# Patient Record
Sex: Male | Born: 1950 | Race: Black or African American | Hispanic: No | Marital: Married | Smoking: Former smoker
Health system: Southern US, Community
[De-identification: ages and names within clinical notes are randomized; demographics above are authoritative.]

## PROBLEM LIST (undated history)

## (undated) DIAGNOSIS — C801 Malignant (primary) neoplasm, unspecified: Secondary | ICD-10-CM

## (undated) DIAGNOSIS — M1990 Unspecified osteoarthritis, unspecified site: Secondary | ICD-10-CM

## (undated) DIAGNOSIS — I10 Essential (primary) hypertension: Secondary | ICD-10-CM

## (undated) DIAGNOSIS — R05 Cough: Secondary | ICD-10-CM

## (undated) DIAGNOSIS — K219 Gastro-esophageal reflux disease without esophagitis: Secondary | ICD-10-CM

## (undated) DIAGNOSIS — E119 Type 2 diabetes mellitus without complications: Secondary | ICD-10-CM

## (undated) DIAGNOSIS — T4145XA Adverse effect of unspecified anesthetic, initial encounter: Secondary | ICD-10-CM

## (undated) DIAGNOSIS — I499 Cardiac arrhythmia, unspecified: Secondary | ICD-10-CM

## (undated) DIAGNOSIS — T8859XA Other complications of anesthesia, initial encounter: Secondary | ICD-10-CM

## (undated) DIAGNOSIS — R059 Cough, unspecified: Secondary | ICD-10-CM

## (undated) DIAGNOSIS — M109 Gout, unspecified: Secondary | ICD-10-CM

## (undated) DIAGNOSIS — I2510 Atherosclerotic heart disease of native coronary artery without angina pectoris: Secondary | ICD-10-CM

## (undated) HISTORY — PX: CARDIAC CATHETERIZATION: SHX172

## (undated) HISTORY — PX: ROTATOR CUFF REPAIR: SHX139

## (undated) HISTORY — PX: COLON SURGERY: SHX602

## (undated) HISTORY — PX: CERVICAL SPINE SURGERY: SHX589

## (undated) HISTORY — PX: CORONARY ANGIOPLASTY: SHX604

## (undated) HISTORY — PX: COLONOSCOPY: SHX174

---

## 2008-03-10 ENCOUNTER — Ambulatory Visit (HOSPITAL_COMMUNITY): Admission: RE | Admit: 2008-03-10 | Discharge: 2008-03-11 | Payer: Self-pay | Admitting: Neurosurgery

## 2010-05-04 LAB — BASIC METABOLIC PANEL
CO2: 28 mEq/L (ref 19–32)
Calcium: 9.3 mg/dL (ref 8.4–10.5)
Creatinine, Ser: 1.35 mg/dL (ref 0.4–1.5)
Glucose, Bld: 95 mg/dL (ref 70–99)

## 2010-05-04 LAB — CBC
Hemoglobin: 13.8 g/dL (ref 13.0–17.0)
MCHC: 33.8 g/dL (ref 30.0–36.0)
MCV: 81.1 fL (ref 78.0–100.0)
RDW: 14 % (ref 11.5–15.5)

## 2010-05-04 LAB — GLUCOSE, CAPILLARY: Glucose-Capillary: 143 mg/dL — ABNORMAL HIGH (ref 70–99)

## 2010-05-04 LAB — DIFFERENTIAL
Basophils Absolute: 0 10*3/uL (ref 0.0–0.1)
Basophils Relative: 1 % (ref 0–1)
Eosinophils Absolute: 0.2 10*3/uL (ref 0.0–0.7)
Monocytes Absolute: 0.6 10*3/uL (ref 0.1–1.0)
Neutro Abs: 2.3 10*3/uL (ref 1.7–7.7)
Neutrophils Relative %: 45 % (ref 43–77)

## 2010-05-04 LAB — TYPE AND SCREEN: ABO/RH(D): A POS

## 2010-06-01 NOTE — Op Note (Signed)
NAMEMICHAL, CALLICOTT             ACCOUNT NO.:  [id number]   MEDICAL RECORD NO.:  [id number]          PATIENT TYPE:  OIB   LOCATION:  3528                         FACILITY:  [facility]   PHYSICIAN:  Henry A. Pool, M.D.    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   PROCEDURE:  C3-4, C4-5 anterior cervical diskectomy and fusion with  allograft and plating.   SURGEON:  Kathaleen Maser. Pool, MD   ASSISTANT:  Donalee Citrin, MD   ANESTHESIA:  General endotracheal.   INDICATION:  Mr. Tuckey is a 60 year old male with a history of neck and  right upper extremity pain consistent with a C4 and C5 radiculopathy.  Situation is complicated by coincidental right-sided rotator cuff  injury.  Workup demonstrates evidence of marked spondylosis with  stenosis, spinal cord and exiting nerve root compression at C3-4 and C4-  5.  It has been decided to move forward with the cervical decompression  and fusion, following which his shoulder would be further assessed for  possible shoulder surgery.  The patient is aware the risks and benefits  involved with surgery and wishes to proceed.   OPERATIVE NOTE:  The patient was placed on operating table in supine  position.  After adequate level of anesthesia was achieved, the patient  was placed supine with the neck slightly extended and held in place with  halter traction.  The patient's anterior cervical region was prepped and  draped sterilely.  A #10 blade was used to make a linear skin incision  overlying the C4 level.  This was carried down sharply to the platysma.  The platysma was then divided vertically and dissection proceeded on the  medial border of the sternocleidomastoid muscle and carotid sheath.  Trachea and esophagus were mobilized and tracked towards the left.  The  prevertebral fascia was stripped off the anterior spinal column.  Longus  colli muscle was elevated bilaterally using electrocautery.  Deep self-  retaining retractor was placed.  Intraoperative fluoroscopy was used,  and C3-4 and C4-5 levels were confirmed.  Disk space then incised at  both levels using a #15 blade in rectangular fashion.  Wide disk space  clean-out was achieved using pituitary rongeurs, forward and backward  Karlin curettes, Kerrison rongeurs, and high-speed drill.  Elements of  the disk removed down to the level of the posterior annulus at both  levels.  Microscope was then brought into the field and used throughout  the remainder of the diskectomy.  The remaining osteophytes and annulus  were removed using high-speed drill down to the level of the posterior  longitudinal ligament.  The posterior longitudinal ligament was then  elevated and resected in piecemeal fashion using Kerrison rongeurs.  The  underlying thecal sac was then identified.  Wide central decompression  was then performed undercutting the bodies of C3 and C4.  Decompression  was then proceeded out to the edges of the neural foramen.  Wide  anterior foraminotomies were then performed along the course of the  exiting C4 nerve roots bilaterally.  At this point, a very thorough  decompression was achieved.  There was no injury to thecal sac or nerve  roots.  Procedure then repeated at C4-5 again without complication.  Wound was then irrigated with antibiotic solution.  Gelfoam was placed  topically.  Hemostasis found to be good.  A 7-mm Cornerstone allograft  wedges were then packed into place and recessed approximately 1 mm from  the anterior cortical margin.  A 42-mm Atlantis anterior cervical plate  was then placed over the C3, C4, and C5 levels.  This attached under  fluoroscopic guidance using 13-mm variable angle screws to each  at all  three levels.  All four screws given final tightening and found to be  solid bone.  Locking screws were then engaged at all three levels.  Final images revealed good position of the bone grafts, hardware at  proper operative level with normal alignment of spine.  The wound was  then irrigated with antibiotic solution.  Hemostasis was ensured with  bipolar electrocautery.  The wound was then closed in typical fashion.  Steri-Strips and sterile dressings were applied.  There were no  complications.  The patient tolerated the procedure well, and he returns  to the recovery room postoperatively.           ______________________________  Kathaleen Maser Pool, M.D.     HAP/MEDQ  D:  03/10/2008  T:  03/11/2008  Job:  [id number]

## 2011-11-08 ENCOUNTER — Other Ambulatory Visit: Payer: Self-pay | Admitting: Neurosurgery

## 2011-11-08 DIAGNOSIS — M5412 Radiculopathy, cervical region: Secondary | ICD-10-CM

## 2011-11-15 ENCOUNTER — Inpatient Hospital Stay: Admission: RE | Admit: 2011-11-15 | Payer: Self-pay | Source: Ambulatory Visit

## 2011-11-15 ENCOUNTER — Other Ambulatory Visit: Payer: Self-pay

## 2011-11-23 ENCOUNTER — Other Ambulatory Visit: Payer: Self-pay | Admitting: Neurosurgery

## 2011-11-23 ENCOUNTER — Ambulatory Visit
Admission: RE | Admit: 2011-11-23 | Discharge: 2011-11-23 | Disposition: A | Discharge status: Home / Self Care | Payer: Medicare Other | Source: Ambulatory Visit | Attending: Neurosurgery | Admitting: Neurosurgery

## 2011-11-23 DIAGNOSIS — M5412 Radiculopathy, cervical region: Secondary | ICD-10-CM

## 2013-05-24 ENCOUNTER — Other Ambulatory Visit: Payer: Self-pay | Admitting: Neurosurgery

## 2013-05-27 ENCOUNTER — Encounter (HOSPITAL_COMMUNITY): Payer: Self-pay | Admitting: Pharmacy Technician

## 2013-05-29 ENCOUNTER — Encounter (HOSPITAL_COMMUNITY): Payer: Self-pay | Admitting: *Deleted

## 2013-05-29 ENCOUNTER — Encounter (HOSPITAL_COMMUNITY)
Admission: RE | Admit: 2013-05-29 | Discharge: 2013-05-29 | Disposition: A | Discharge status: Home / Self Care | Payer: BC Managed Care – PPO | Source: Ambulatory Visit | Attending: Neurosurgery | Admitting: Neurosurgery

## 2013-05-29 ENCOUNTER — Ambulatory Visit (HOSPITAL_COMMUNITY)
Admission: RE | Admit: 2013-05-29 | Discharge: 2013-05-29 | Disposition: A | Discharge status: Home / Self Care | Payer: BC Managed Care – PPO | Source: Ambulatory Visit | Attending: Neurosurgery | Admitting: Neurosurgery

## 2013-05-29 DIAGNOSIS — R05 Cough: Secondary | ICD-10-CM | POA: Diagnosis not present

## 2013-05-29 DIAGNOSIS — I10 Essential (primary) hypertension: Secondary | ICD-10-CM | POA: Diagnosis not present

## 2013-05-29 DIAGNOSIS — R059 Cough, unspecified: Secondary | ICD-10-CM | POA: Diagnosis not present

## 2013-05-29 DIAGNOSIS — Z01818 Encounter for other preprocedural examination: Secondary | ICD-10-CM | POA: Diagnosis not present

## 2013-05-29 LAB — CBC WITH DIFFERENTIAL/PLATELET
BASOS ABS: 0 10*3/uL (ref 0.0–0.1)
BASOS PCT: 0 % (ref 0–1)
EOS ABS: 0.4 10*3/uL (ref 0.0–0.7)
Eosinophils Relative: 8 % — ABNORMAL HIGH (ref 0–5)
HEMATOCRIT: 38 % — AB (ref 39.0–52.0)
HEMOGLOBIN: 12.4 g/dL — AB (ref 13.0–17.0)
Lymphocytes Relative: 37 % (ref 12–46)
Lymphs Abs: 1.7 10*3/uL (ref 0.7–4.0)
MCH: 25.6 pg — ABNORMAL LOW (ref 26.0–34.0)
MCHC: 32.6 g/dL (ref 30.0–36.0)
MCV: 78.4 fL (ref 78.0–100.0)
MONO ABS: 0.3 10*3/uL (ref 0.1–1.0)
MONOS PCT: 7 % (ref 3–12)
Neutro Abs: 2.2 10*3/uL (ref 1.7–7.7)
Neutrophils Relative %: 48 % (ref 43–77)
Platelets: 151 10*3/uL (ref 150–400)
RBC: 4.85 MIL/uL (ref 4.22–5.81)
RDW: 15.5 % (ref 11.5–15.5)
WBC: 4.7 10*3/uL (ref 4.0–10.5)

## 2013-05-29 LAB — BASIC METABOLIC PANEL
BUN: 14 mg/dL (ref 6–23)
CHLORIDE: 103 meq/L (ref 96–112)
CO2: 25 mEq/L (ref 19–32)
CREATININE: 1.2 mg/dL (ref 0.50–1.35)
Calcium: 8.8 mg/dL (ref 8.4–10.5)
GFR calc Af Amer: 74 mL/min — ABNORMAL LOW (ref >90)
GFR calc non Af Amer: 64 mL/min — ABNORMAL LOW (ref >90)
GLUCOSE: 162 mg/dL — AB (ref 70–99)
POTASSIUM: 3.4 meq/L — AB (ref 3.7–5.3)
Sodium: 142 mEq/L (ref 137–147)

## 2013-05-29 LAB — SURGICAL PCR SCREEN
MRSA, PCR: NEGATIVE
Staphylococcus aureus: NEGATIVE

## 2013-05-29 NOTE — Progress Notes (Signed)
REQUESTED CARDIAC CLEARANCE FROM DR. CHIU HIGH PT  REGIONAL, CARDIAC CATH, STRESS TEST, ? ECHO.

## 2013-05-29 NOTE — Pre-Procedure Instructions (Signed)
Asante Blanda Letizia  05/29/2013   Your procedure is scheduled on: Friday  05/31/13   Report to [facility] Admitting at 530 AM.  Call this number if you have problems the morning of surgery: [phone]   Remember:   Do not eat food or drink liquids after midnight.   Take these medicines the morning of surgery with A SIP OF WATER:  NEXIUM, METOPROLOL(TOPROL), POTASSIUM, RANITIDINE,  FLOMAX    Do not wear jewelry, make-up or nail polish.  Do not wear lotions, powders, or perfumes. You may wear deodorant.  Do not shave 48 hours prior to surgery. Men may shave face and neck.  Do not bring valuables to the hospital.  [facility] is not responsible                  for any belongings or valuables.               Contacts, dentures or bridgework may not be worn into surgery.  Leave suitcase in the car. After surgery it may be brought to your room.  For patients admitted to the hospital, discharge time is determined by your                treatment team.               Patients discharged the day of surgery will not be allowed to drive  home.  Name and phone number of your driver:   Special Instructions:  Special Instructions: [facility] - Preparing for Surgery  Before surgery, you can play an important role.  Because skin is not sterile, your skin needs to be as free of germs as possible.  You can reduce the number of germs on you skin by washing with CHG (chlorahexidine gluconate) soap before surgery.  CHG is an antiseptic cleaner which kills germs and bonds with the skin to continue killing germs even after washing.  Please DO NOT use if you have an allergy to CHG or antibacterial soaps.  If your skin becomes reddened/irritated stop using the CHG and inform your nurse when you arrive at Short Stay.  Do not shave (including legs and underarms) for at least 48 hours prior to the first CHG shower.  You may shave your face.  Please follow these instructions carefully:   1.  Shower  with CHG Soap the night before surgery and the morning of Surgery.  2.  If you choose to wash your hair, wash your hair first as usual with your normal shampoo.  3.  After you shampoo, rinse your hair and body thoroughly to remove the Shampoo.  4.  Use CHG as you would any other liquid soap. You can apply chg directly to the skin and wash gently with scrungie or a clean washcloth.  5.  Apply the CHG Soap to your body ONLY FROM THE NECK DOWN.  Do not use on open wounds or open sores.  Avoid contact with your eyes, ears, mouth and genitals (private parts).  Wash genitals (private parts with your normal soap.  6.  Wash thoroughly, paying special attention to the area where your surgery will be performed.  7.  Thoroughly rinse your body with warm water from the neck down.  8.  DO NOT shower/wash with your normal soap after using and rinsing off the CHG Soap.  9.  Pat yourself dry with a clean towel.            10.  Wear clean pajamas.            11.  Place clean sheets on your bed the night of your first shower and do not sleep with pets.  Day of Surgery  Do not apply any lotions/deodorants the morning of surgery.  Please wear clean clothes to the hospital/surgery center.   Please read over the following fact sheets that you were given: Pain Booklet, Coughing and Deep Breathing, MRSA Information and Surgical Site Infection Prevention

## 2013-05-29 NOTE — Progress Notes (Signed)
05/29/13 1411  OBSTRUCTIVE SLEEP APNEA  Have you ever been diagnosed with sleep apnea through a sleep study? No  Do you snore loudly (loud enough to be heard through closed doors)?  1  Do you often feel tired, fatigued, or sleepy during the daytime? 1  Has anyone observed you stop breathing during your sleep? 0  Do you have, or are you being treated for high blood pressure? 1  BMI more than 35 kg/m2? 1  Age over 61 years old? 1  Neck circumference greater than 40 cm/16 inches? 1  Gender: 1  Obstructive Sleep Apnea Score 7  Score 4 or greater  Results sent to PCP

## 2013-05-30 ENCOUNTER — Encounter (HOSPITAL_COMMUNITY): Payer: Self-pay

## 2013-05-30 HISTORY — PX: CARDIOVASCULAR STRESS TEST: SHX262

## 2013-05-30 MED ORDER — DEXAMETHASONE SODIUM PHOSPHATE 10 MG/ML IJ SOLN
10.0000 mg | INTRAMUSCULAR | Status: AC
  Administered 2013-05-31: 10 mg via INTRAVENOUS
  Filled 2013-05-30: qty 1

## 2013-05-30 MED ORDER — CEFAZOLIN SODIUM-DEXTROSE 2-3 GM-% IV SOLR
2.0000 g | INTRAVENOUS | Status: AC
  Administered 2013-05-31: 2 g via INTRAVENOUS
  Filled 2013-05-30: qty 50

## 2013-05-30 NOTE — Progress Notes (Signed)
Anesthesia Note: I received a fax after 5 PM today that includes a clearance note signed by Roseanne Reno, PA-C/for Dr. Wyline Copas clearing patient for surgery. He had a nuclear stress test today at [facility] that showed: No evidence of pharmacological induced ischemia. Fixed defect involving the inferiolateral wall along the mid and basilar segments. An area of infarct cannot be excluded based on the hypokinesia in this area. Calculated EF is 46%.  Further evaluation per his anesthesiologist tomorrow.  George Hugh [facility]/Anesthesiology Phone [phone] 05/30/2013 6:55 PM

## 2013-05-31 ENCOUNTER — Encounter (HOSPITAL_COMMUNITY)
Admission: RE | Disposition: A | Discharge status: Home / Self Care | Payer: Self-pay | Source: Ambulatory Visit | Attending: Neurosurgery

## 2013-05-31 ENCOUNTER — Inpatient Hospital Stay (HOSPITAL_COMMUNITY)
Admission: RE | Admit: 2013-05-31 | Discharge: 2013-06-01 | DRG: 472 | Disposition: A | Discharge status: Home / Self Care | Payer: BC Managed Care – PPO | Source: Ambulatory Visit | Attending: Neurosurgery | Admitting: Neurosurgery

## 2013-05-31 ENCOUNTER — Encounter (HOSPITAL_COMMUNITY): Payer: BC Managed Care – PPO | Admitting: Vascular Surgery

## 2013-05-31 ENCOUNTER — Inpatient Hospital Stay (HOSPITAL_COMMUNITY): Payer: BC Managed Care – PPO | Admitting: Certified Registered"

## 2013-05-31 ENCOUNTER — Encounter (HOSPITAL_COMMUNITY): Payer: Self-pay | Admitting: *Deleted

## 2013-05-31 ENCOUNTER — Inpatient Hospital Stay (HOSPITAL_COMMUNITY): Payer: BC Managed Care – PPO

## 2013-05-31 DIAGNOSIS — Z79899 Other long term (current) drug therapy: Secondary | ICD-10-CM

## 2013-05-31 DIAGNOSIS — Z7982 Long term (current) use of aspirin: Secondary | ICD-10-CM

## 2013-05-31 DIAGNOSIS — M5000 Cervical disc disorder with myelopathy, unspecified cervical region: Secondary | ICD-10-CM | POA: Diagnosis present

## 2013-05-31 DIAGNOSIS — Z85038 Personal history of other malignant neoplasm of large intestine: Secondary | ICD-10-CM

## 2013-05-31 DIAGNOSIS — K219 Gastro-esophageal reflux disease without esophagitis: Secondary | ICD-10-CM | POA: Diagnosis present

## 2013-05-31 DIAGNOSIS — Z9861 Coronary angioplasty status: Secondary | ICD-10-CM

## 2013-05-31 DIAGNOSIS — M4712 Other spondylosis with myelopathy, cervical region: Principal | ICD-10-CM | POA: Diagnosis present

## 2013-05-31 DIAGNOSIS — M4802 Spinal stenosis, cervical region: Secondary | ICD-10-CM | POA: Diagnosis present

## 2013-05-31 DIAGNOSIS — I10 Essential (primary) hypertension: Secondary | ICD-10-CM | POA: Diagnosis present

## 2013-05-31 DIAGNOSIS — Z7902 Long term (current) use of antithrombotics/antiplatelets: Secondary | ICD-10-CM

## 2013-05-31 DIAGNOSIS — Z87891 Personal history of nicotine dependence: Secondary | ICD-10-CM

## 2013-05-31 DIAGNOSIS — E119 Type 2 diabetes mellitus without complications: Secondary | ICD-10-CM | POA: Diagnosis present

## 2013-05-31 HISTORY — DX: Cough, unspecified: R05.9

## 2013-05-31 HISTORY — DX: Cough: R05

## 2013-05-31 HISTORY — DX: Unspecified osteoarthritis, unspecified site: M19.90

## 2013-05-31 HISTORY — DX: Essential (primary) hypertension: I10

## 2013-05-31 HISTORY — DX: Gastro-esophageal reflux disease without esophagitis: K21.9

## 2013-05-31 HISTORY — DX: Type 2 diabetes mellitus without complications: E11.9

## 2013-05-31 HISTORY — PX: ANTERIOR CERVICAL DECOMP/DISCECTOMY FUSION: SHX1161

## 2013-05-31 HISTORY — DX: Malignant (primary) neoplasm, unspecified: C80.1

## 2013-05-31 HISTORY — DX: Cardiac arrhythmia, unspecified: I49.9

## 2013-05-31 LAB — GLUCOSE, CAPILLARY
GLUCOSE-CAPILLARY: 150 mg/dL — AB (ref 70–99)
Glucose-Capillary: 131 mg/dL — ABNORMAL HIGH (ref 70–99)
Glucose-Capillary: 149 mg/dL — ABNORMAL HIGH (ref 70–99)
Glucose-Capillary: 151 mg/dL — ABNORMAL HIGH (ref 70–99)
Glucose-Capillary: 182 mg/dL — ABNORMAL HIGH (ref 70–99)

## 2013-05-31 SURGERY — ANTERIOR CERVICAL DECOMPRESSION/DISCECTOMY FUSION 2 LEVELS
Anesthesia: General

## 2013-05-31 MED ORDER — ATORVASTATIN CALCIUM 40 MG PO TABS
40.0000 mg | ORAL_TABLET | Freq: Every evening | ORAL | Status: DC
  Administered 2013-05-31: 40 mg via ORAL
  Filled 2013-05-31 (×2): qty 1

## 2013-05-31 MED ORDER — HYDROMORPHONE HCL PF 1 MG/ML IJ SOLN
INTRAMUSCULAR | Status: AC
  Administered 2013-05-31: 0.5 mg via INTRAVENOUS
  Filled 2013-05-31: qty 1

## 2013-05-31 MED ORDER — SENNA 8.6 MG PO TABS
1.0000 | ORAL_TABLET | Freq: Two times a day (BID) | ORAL | Status: DC
  Administered 2013-05-31: 8.6 mg via ORAL
  Filled 2013-05-31 (×3): qty 1

## 2013-05-31 MED ORDER — MENTHOL 3 MG MT LOZG: 1.0000 | LOZENGE | OROMUCOSAL | Status: DC | PRN

## 2013-05-31 MED ORDER — FAMOTIDINE 20 MG PO TABS
20.0000 mg | ORAL_TABLET | Freq: Two times a day (BID) | ORAL | Status: DC
  Administered 2013-05-31: 20 mg via ORAL
  Filled 2013-05-31 (×3): qty 1

## 2013-05-31 MED ORDER — OXYCODONE HCL 5 MG/5ML PO SOLN: 5.0000 mg | Freq: Once | ORAL | Status: AC | PRN

## 2013-05-31 MED ORDER — METOPROLOL SUCCINATE ER 50 MG PO TB24
50.0000 mg | ORAL_TABLET | Freq: Every day | ORAL | Status: DC
  Administered 2013-06-01: 50 mg via ORAL
  Filled 2013-05-31 (×2): qty 1

## 2013-05-31 MED ORDER — POTASSIUM CHLORIDE CRYS ER 10 MEQ PO TBCR
10.0000 meq | EXTENDED_RELEASE_TABLET | Freq: Two times a day (BID) | ORAL | Status: DC
  Administered 2013-05-31 (×2): 10 meq via ORAL
  Filled 2013-05-31 (×4): qty 1

## 2013-05-31 MED ORDER — LIDOCAINE HCL (CARDIAC) 20 MG/ML IV SOLN
INTRAVENOUS | Status: DC | PRN
  Administered 2013-05-31: 160 mg via INTRAVENOUS

## 2013-05-31 MED ORDER — GLYCOPYRROLATE 0.2 MG/ML IJ SOLN
INTRAMUSCULAR | Status: DC | PRN
  Administered 2013-05-31: 0.6 mg via INTRAVENOUS

## 2013-05-31 MED ORDER — ONDANSETRON HCL 4 MG/2ML IJ SOLN: 4.0000 mg | INTRAMUSCULAR | Status: DC | PRN

## 2013-05-31 MED ORDER — FENTANYL CITRATE 0.05 MG/ML IJ SOLN
INTRAMUSCULAR | Status: DC | PRN
  Administered 2013-05-31: 100 ug via INTRAVENOUS
  Administered 2013-05-31 (×2): 25 ug via INTRAVENOUS
  Administered 2013-05-31: 100 ug via INTRAVENOUS
  Administered 2013-05-31: 25 ug via INTRAVENOUS
  Administered 2013-05-31: 50 ug via INTRAVENOUS
  Administered 2013-05-31: 25 ug via INTRAVENOUS

## 2013-05-31 MED ORDER — TAMSULOSIN HCL 0.4 MG PO CAPS
0.4000 mg | ORAL_CAPSULE | Freq: Every day | ORAL | Status: DC
  Administered 2013-05-31: 0.4 mg via ORAL
  Filled 2013-05-31 (×2): qty 1

## 2013-05-31 MED ORDER — HYDROMORPHONE HCL PF 1 MG/ML IJ SOLN: 0.5000 mg | INTRAMUSCULAR | Status: DC | PRN

## 2013-05-31 MED ORDER — HYDROCHLOROTHIAZIDE 25 MG PO TABS
25.0000 mg | ORAL_TABLET | Freq: Every day | ORAL | Status: DC
  Administered 2013-05-31 – 2013-06-01 (×2): 25 mg via ORAL
  Filled 2013-05-31 (×3): qty 1

## 2013-05-31 MED ORDER — FENTANYL CITRATE 0.05 MG/ML IJ SOLN
INTRAMUSCULAR | Status: AC
  Filled 2013-05-31: qty 5

## 2013-05-31 MED ORDER — ALUM & MAG HYDROXIDE-SIMETH 200-200-20 MG/5ML PO SUSP: 30.0000 mL | Freq: Four times a day (QID) | ORAL | Status: DC | PRN

## 2013-05-31 MED ORDER — SODIUM CHLORIDE 0.9 % IR SOLN
Status: DC | PRN
  Administered 2013-05-31: 08:00:00

## 2013-05-31 MED ORDER — NEOSTIGMINE METHYLSULFATE 10 MG/10ML IV SOLN
INTRAVENOUS | Status: DC | PRN
  Administered 2013-05-31: 4 mg via INTRAVENOUS

## 2013-05-31 MED ORDER — ROCURONIUM BROMIDE 100 MG/10ML IV SOLN
INTRAVENOUS | Status: DC | PRN
  Administered 2013-05-31: 50 mg via INTRAVENOUS
  Administered 2013-05-31: 5 mg via INTRAVENOUS
  Administered 2013-05-31: 10 mg via INTRAVENOUS

## 2013-05-31 MED ORDER — PROPOFOL 10 MG/ML IV BOLUS
INTRAVENOUS | Status: AC
  Filled 2013-05-31: qty 20

## 2013-05-31 MED ORDER — HYDROCODONE-ACETAMINOPHEN 5-325 MG PO TABS: 1.0000 | ORAL_TABLET | ORAL | Status: DC | PRN

## 2013-05-31 MED ORDER — OXYCODONE HCL 5 MG PO TABS
ORAL_TABLET | ORAL | Status: AC
  Filled 2013-05-31: qty 1

## 2013-05-31 MED ORDER — PANTOPRAZOLE SODIUM 40 MG PO TBEC: 80.0000 mg | DELAYED_RELEASE_TABLET | Freq: Every day | ORAL | Status: DC

## 2013-05-31 MED ORDER — LISINOPRIL 40 MG PO TABS
40.0000 mg | ORAL_TABLET | Freq: Every day | ORAL | Status: DC
  Administered 2013-05-31: 40 mg via ORAL
  Filled 2013-05-31: qty 16
  Filled 2013-05-31: qty 1

## 2013-05-31 MED ORDER — GLYCOPYRROLATE 0.2 MG/ML IJ SOLN
INTRAMUSCULAR | Status: AC
  Filled 2013-05-31: qty 3

## 2013-05-31 MED ORDER — ONDANSETRON HCL 4 MG/2ML IJ SOLN
INTRAMUSCULAR | Status: AC
  Filled 2013-05-31: qty 2

## 2013-05-31 MED ORDER — SODIUM CHLORIDE 0.9 % IJ SOLN: 3.0000 mL | INTRAMUSCULAR | Status: DC | PRN

## 2013-05-31 MED ORDER — METFORMIN HCL 500 MG PO TABS
500.0000 mg | ORAL_TABLET | Freq: Two times a day (BID) | ORAL | Status: DC
  Administered 2013-05-31 – 2013-06-01 (×2): 500 mg via ORAL
  Filled 2013-05-31 (×4): qty 1

## 2013-05-31 MED ORDER — ACETAMINOPHEN 325 MG PO TABS: 650.0000 mg | ORAL_TABLET | ORAL | Status: DC | PRN

## 2013-05-31 MED ORDER — ONDANSETRON HCL 4 MG/2ML IJ SOLN: 4.0000 mg | Freq: Once | INTRAMUSCULAR | Status: DC | PRN

## 2013-05-31 MED ORDER — EPHEDRINE SULFATE 50 MG/ML IJ SOLN
INTRAMUSCULAR | Status: DC | PRN
  Administered 2013-05-31: 5 mg via INTRAVENOUS

## 2013-05-31 MED ORDER — LACTATED RINGERS IV SOLN
INTRAVENOUS | Status: DC | PRN
  Administered 2013-05-31 (×3): via INTRAVENOUS

## 2013-05-31 MED ORDER — HEMOSTATIC AGENTS (NO CHARGE) OPTIME
TOPICAL | Status: DC | PRN
  Administered 2013-05-31: 1 via TOPICAL

## 2013-05-31 MED ORDER — THROMBIN 5000 UNITS EX SOLR
CUTANEOUS | Status: DC | PRN
  Administered 2013-05-31 (×2): 5000 [IU] via TOPICAL

## 2013-05-31 MED ORDER — MIDAZOLAM HCL 2 MG/2ML IJ SOLN
INTRAMUSCULAR | Status: AC
  Filled 2013-05-31: qty 2

## 2013-05-31 MED ORDER — LIDOCAINE HCL (CARDIAC) 20 MG/ML IV SOLN
INTRAVENOUS | Status: AC
  Filled 2013-05-31: qty 5

## 2013-05-31 MED ORDER — OXYCODONE-ACETAMINOPHEN 5-325 MG PO TABS
1.0000 | ORAL_TABLET | ORAL | Status: DC | PRN
  Administered 2013-05-31 – 2013-06-01 (×3): 2 via ORAL
  Filled 2013-05-31 (×3): qty 2

## 2013-05-31 MED ORDER — CEFAZOLIN SODIUM 1-5 GM-% IV SOLN
1.0000 g | Freq: Three times a day (TID) | INTRAVENOUS | Status: AC
  Administered 2013-05-31 (×2): 1 g via INTRAVENOUS
  Filled 2013-05-31 (×2): qty 50

## 2013-05-31 MED ORDER — CYCLOBENZAPRINE HCL 10 MG PO TABS
10.0000 mg | ORAL_TABLET | Freq: Three times a day (TID) | ORAL | Status: DC | PRN
  Administered 2013-05-31 – 2013-06-01 (×2): 10 mg via ORAL
  Filled 2013-05-31 (×2): qty 1

## 2013-05-31 MED ORDER — MIDAZOLAM HCL 5 MG/5ML IJ SOLN
INTRAMUSCULAR | Status: DC | PRN
  Administered 2013-05-31 (×2): 1 mg via INTRAVENOUS

## 2013-05-31 MED ORDER — ONDANSETRON HCL 4 MG/2ML IJ SOLN
INTRAMUSCULAR | Status: DC | PRN
  Administered 2013-05-31: 4 mg via INTRAVENOUS

## 2013-05-31 MED ORDER — SODIUM CHLORIDE 0.9 % IV SOLN: 250.0000 mL | INTRAVENOUS | Status: DC

## 2013-05-31 MED ORDER — HYDROMORPHONE HCL PF 1 MG/ML IJ SOLN
0.2500 mg | INTRAMUSCULAR | Status: DC | PRN
  Administered 2013-05-31 (×3): 0.5 mg via INTRAVENOUS

## 2013-05-31 MED ORDER — ROCURONIUM BROMIDE 50 MG/5ML IV SOLN
INTRAVENOUS | Status: AC
  Filled 2013-05-31: qty 1

## 2013-05-31 MED ORDER — NEOSTIGMINE METHYLSULFATE 10 MG/10ML IV SOLN
INTRAVENOUS | Status: AC
  Filled 2013-05-31: qty 1

## 2013-05-31 MED ORDER — ACETAMINOPHEN 650 MG RE SUPP: 650.0000 mg | RECTAL | Status: DC | PRN

## 2013-05-31 MED ORDER — PHENOL 1.4 % MT LIQD: 1.0000 | OROMUCOSAL | Status: DC | PRN

## 2013-05-31 MED ORDER — 0.9 % SODIUM CHLORIDE (POUR BTL) OPTIME
TOPICAL | Status: DC | PRN
  Administered 2013-05-31: 1000 mL

## 2013-05-31 MED ORDER — PROPOFOL 10 MG/ML IV BOLUS
INTRAVENOUS | Status: DC | PRN
  Administered 2013-05-31: 180 mg via INTRAVENOUS

## 2013-05-31 MED ORDER — SODIUM CHLORIDE 0.9 % IV SOLN
10.0000 mg | INTRAVENOUS | Status: DC | PRN
  Administered 2013-05-31: 10 ug/min via INTRAVENOUS

## 2013-05-31 MED ORDER — SODIUM CHLORIDE 0.9 % IJ SOLN
3.0000 mL | Freq: Two times a day (BID) | INTRAMUSCULAR | Status: DC
  Administered 2013-05-31: 3 mL via INTRAVENOUS

## 2013-05-31 MED ORDER — ALBUMIN HUMAN 5 % IV SOLN
INTRAVENOUS | Status: DC | PRN
Start: 2013-05-31 — End: 2013-05-31
  Administered 2013-05-31: 11:00:00 via INTRAVENOUS

## 2013-05-31 MED ORDER — LINAGLIPTIN 5 MG PO TABS
5.0000 mg | ORAL_TABLET | Freq: Every day | ORAL | Status: DC
  Administered 2013-05-31: 5 mg via ORAL
  Filled 2013-05-31 (×2): qty 1

## 2013-05-31 MED ORDER — OXYCODONE HCL 5 MG PO TABS
5.0000 mg | ORAL_TABLET | Freq: Once | ORAL | Status: AC | PRN
  Administered 2013-05-31: 5 mg via ORAL

## 2013-05-31 SURGICAL SUPPLY — 70 items
BAG DECANTER FOR FLEXI CONT (MISCELLANEOUS) ×3 IMPLANT
BENZOIN TINCTURE PRP APPL 2/3 (GAUZE/BANDAGES/DRESSINGS) ×3 IMPLANT
BLADE 10 SAFETY STRL DISP (BLADE) ×3 IMPLANT
BRUSH SCRUB EZ PLAIN DRY (MISCELLANEOUS) ×3 IMPLANT
BUR MATCHSTICK NEURO 3.0 LAGG (BURR) ×3 IMPLANT
CAGE PEEK 7X14X11 (Cage) ×2 IMPLANT
CANISTER SUCT 3000ML (MISCELLANEOUS) ×3 IMPLANT
CLOSURE WOUND 1/2 X4 (GAUZE/BANDAGES/DRESSINGS) ×1
CONT SPEC 4OZ CLIKSEAL STRL BL (MISCELLANEOUS) ×3 IMPLANT
DRAPE C-ARM 42X72 X-RAY (DRAPES) ×6 IMPLANT
DRAPE LAPAROTOMY 100X72 PEDS (DRAPES) ×3 IMPLANT
DRAPE MICROSCOPE ZEISS OPMI (DRAPES) ×3 IMPLANT
DRAPE POUCH INSTRU U-SHP 10X18 (DRAPES) ×3 IMPLANT
DRILL BIT (BIT) ×3 IMPLANT
DURAPREP 6ML APPLICATOR 50/CS (WOUND CARE) ×3 IMPLANT
ELECT COATED BLADE 2.86 ST (ELECTRODE) ×3 IMPLANT
ELECT REM PT RETURN 9FT ADLT (ELECTROSURGICAL) ×3
ELECTRODE REM PT RTRN 9FT ADLT (ELECTROSURGICAL) ×1 IMPLANT
EVACUATOR 1/8 PVC DRAIN (DRAIN) ×3 IMPLANT
GAUZE SPONGE 4X4 16PLY XRAY LF (GAUZE/BANDAGES/DRESSINGS) ×3 IMPLANT
GLOVE BIO SURGEON STRL SZ8 (GLOVE) ×6 IMPLANT
GLOVE BIOGEL PI IND STRL 7.0 (GLOVE) ×1 IMPLANT
GLOVE BIOGEL PI IND STRL 8.5 (GLOVE) ×2 IMPLANT
GLOVE BIOGEL PI INDICATOR 7.0 (GLOVE) ×2
GLOVE BIOGEL PI INDICATOR 8.5 (GLOVE) ×4
GLOVE ECLIPSE 8.5 STRL (GLOVE) ×3 IMPLANT
GLOVE ECLIPSE 9.0 STRL (GLOVE) IMPLANT
GLOVE EXAM NITRILE LRG STRL (GLOVE) IMPLANT
GLOVE EXAM NITRILE MD LF STRL (GLOVE) ×3 IMPLANT
GLOVE EXAM NITRILE XL STR (GLOVE) IMPLANT
GLOVE EXAM NITRILE XS STR PU (GLOVE) IMPLANT
GLOVE SS BIOGEL STRL SZ 6.5 (GLOVE) ×3 IMPLANT
GLOVE SUPERSENSE BIOGEL SZ 6.5 (GLOVE) ×6
GOWN BRE IMP SLV AUR LG STRL (GOWN DISPOSABLE) IMPLANT
GOWN BRE IMP SLV AUR XL STRL (GOWN DISPOSABLE) IMPLANT
GOWN STRL REIN 2XL LVL4 (GOWN DISPOSABLE) IMPLANT
GOWN STRL REUS W/ TWL LRG LVL3 (GOWN DISPOSABLE) ×1 IMPLANT
GOWN STRL REUS W/ TWL XL LVL3 (GOWN DISPOSABLE) ×1 IMPLANT
GOWN STRL REUS W/TWL LRG LVL3 (GOWN DISPOSABLE) ×2
GOWN STRL REUS W/TWL XL LVL3 (GOWN DISPOSABLE) ×2
HALTER HD/CHIN CERV TRACTION D (MISCELLANEOUS) ×3 IMPLANT
HEMOSTAT SURGICEL 2X14 (HEMOSTASIS) IMPLANT
KIT BASIN OR (CUSTOM PROCEDURE TRAY) ×3 IMPLANT
KIT ROOM TURNOVER OR (KITS) ×3 IMPLANT
NEEDLE SPNL 20GX3.5 QUINCKE YW (NEEDLE) ×3 IMPLANT
NS IRRIG 1000ML POUR BTL (IV SOLUTION) ×3 IMPLANT
PACK LAMINECTOMY NEURO (CUSTOM PROCEDURE TRAY) ×3 IMPLANT
PAD ARMBOARD 7.5X6 YLW CONV (MISCELLANEOUS) ×9 IMPLANT
PEEK CAGE 8X14X11 (Peek) ×3 IMPLANT
PLATE ELITE 42MM (Plate) ×3 IMPLANT
RUBBERBAND STERILE (MISCELLANEOUS) ×6 IMPLANT
SCREW ST 13X4.5XST VAR NS (Screw) ×2 IMPLANT
SCREW ST 13X4XST VA NS SPNE (Screw) ×4 IMPLANT
SCREW ST VAR 4 ATL (Screw) ×8 IMPLANT
SCREW ST VAR 4.5 ATL (Screw) ×4 IMPLANT
SPACER SPNL 11X14X7XPEEK CVD (Cage) ×1 IMPLANT
SPCR SPNL 11X14X7XPEEK CVD (Cage) ×1 IMPLANT
SPONGE GAUZE 4X4 12PLY (GAUZE/BANDAGES/DRESSINGS) ×3 IMPLANT
SPONGE INTESTINAL PEANUT (DISPOSABLE) ×3 IMPLANT
SPONGE SURGIFOAM ABS GEL SZ50 (HEMOSTASIS) ×3 IMPLANT
STRIP CLOSURE SKIN 1/2X4 (GAUZE/BANDAGES/DRESSINGS) ×2 IMPLANT
SUT PDS AB 5-0 P3 18 (SUTURE) ×3 IMPLANT
SUT VIC AB 3-0 SH 8-18 (SUTURE) ×3 IMPLANT
SYR 20ML ECCENTRIC (SYRINGE) ×3 IMPLANT
TAPE CLOTH 4X10 WHT NS (GAUZE/BANDAGES/DRESSINGS) ×3 IMPLANT
TAPE CLOTH SURG 4X10 WHT LF (GAUZE/BANDAGES/DRESSINGS) ×3 IMPLANT
TOWEL OR 17X24 6PK STRL BLUE (TOWEL DISPOSABLE) ×3 IMPLANT
TOWEL OR 17X26 10 PK STRL BLUE (TOWEL DISPOSABLE) ×3 IMPLANT
TRAP SPECIMEN MUCOUS 40CC (MISCELLANEOUS) ×3 IMPLANT
WATER STERILE IRR 1000ML POUR (IV SOLUTION) ×3 IMPLANT

## 2013-05-31 NOTE — H&P (Signed)
Bruce Dyer is an 63 y.o. male.   Chief Complaint: Neck and arm pain HPI: 63 year old male status post previous C3-4 and C4-5 anterior cervical decompression and fusion with good results presents now with worsening neck pain with radiation into both upper trimming these right greater than left. Patient developing worsening clumsiness in his hands. Workup demonstrates evidence of solid fusion good decompression at 2 operative levels. C5-6 patient has moderate spondylosis. C6-7 he has a broad-based disc herniation with spinal cord compression BioStop toward the right. He presents now for 2 level anterior cervical decompression infusion in hopes of improving his symptoms.  Past Medical History  Diagnosis Date  . Hypertension   . Dysrhythmia     IRREG HEARTBEAT DR. CHIU   . Cough   . Diabetes mellitus without complication   . GERD (gastroesophageal reflux disease)   . Arthritis   . Cancer     HX COLON CA 2013    Past Surgical History  Procedure Laterality Date  . Cardiac catheterization    . Rotator cuff repair      BILAT  . Colon surgery    . Coronary angioplasty      STENT  . Cervical spine surgery    . Cardiovascular stress test  05/30/13    fixed defect involving the inferolateral wall along the mid and basilar segments; an area of infarct cannot be excluded; no evidence of ischemia, EF 46% (HPR)    History reviewed. No pertinent family history. Social History:  reports that he has quit smoking. He does not have any smokeless tobacco history on file. He reports that he does not drink alcohol or use illicit drugs.  Allergies: No Known Allergies  Medications Prior to Admission  Medication Sig Dispense Refill  . aspirin 325 MG tablet Take 325 mg by mouth daily.      Marland Kitchen atorvastatin (LIPITOR) 40 MG tablet Take 40 mg by mouth every evening.      . clopidogrel (PLAVIX) 75 MG tablet Take 75 mg by mouth daily with breakfast.      . esomeprazole (NEXIUM) 40 MG capsule Take 40 mg by  mouth every evening.      . hydrochlorothiazide (HYDRODIURIL) 25 MG tablet Take 25 mg by mouth daily.      . metFORMIN (GLUCOPHAGE) 500 MG tablet Take 500 mg by mouth 2 (two) times daily with a meal.      . metoprolol succinate (TOPROL-XL) 50 MG 24 hr tablet Take 50 mg by mouth daily. Take with or immediately following a meal.      . potassium chloride (K-DUR,KLOR-CON) 10 MEQ tablet Take 10 mEq by mouth 2 (two) times daily.      . quinapril (ACCUPRIL) 40 MG tablet Take 40 mg by mouth at bedtime.      . ranitidine (ZANTAC) 150 MG tablet Take 150 mg by mouth every morning.      . sitaGLIPtin (JANUVIA) 100 MG tablet Take 100 mg by mouth daily.      . tamsulosin (FLOMAX) 0.4 MG CAPS capsule Take 0.4 mg by mouth daily after supper.      . Trospium Chloride 60 MG CP24 Take 60 mg by mouth every evening.        Results for orders placed during the hospital encounter of 05/31/13 (from the past 48 hour(s))  SURGICAL PCR SCREEN     Status: None   Collection Time    05/29/13  2:31 PM      Result Value Ref  Range   MRSA, PCR NEGATIVE  NEGATIVE   Staphylococcus aureus NEGATIVE  NEGATIVE   Comment:            The Xpert SA Assay (FDA     approved for NASAL specimens     in patients over 71 years of age),     is one component of     a comprehensive surveillance     program.  Test performance has     been validated by [employer] for patients greater     than or equal to 74 year old.     It is not intended     to diagnose infection nor to     guide or monitor treatment.  BASIC METABOLIC PANEL     Status: Abnormal   Collection Time    05/29/13  2:32 PM      Result Value Ref Range   Sodium 142  137 - 147 mEq/L   Potassium 3.4 (*) 3.7 - 5.3 mEq/L   Chloride 103  96 - 112 mEq/L   CO2 25  19 - 32 mEq/L   Glucose, Bld 162 (*) 70 - 99 mg/dL   BUN 14  6 - 23 mg/dL   Creatinine, Ser 1.20  0.50 - 1.35 mg/dL   Calcium 8.8  8.4 - 10.5 mg/dL   GFR calc non Af Amer 64 (*) >90 mL/min   GFR calc Af  Amer 74 (*) >90 mL/min   Comment: (NOTE)     The eGFR has been calculated using the CKD EPI equation.     This calculation has not been validated in all clinical situations.     eGFR's persistently <90 mL/min signify possible Chronic Kidney     Disease.  CBC WITH DIFFERENTIAL     Status: Abnormal   Collection Time    05/29/13  2:32 PM      Result Value Ref Range   WBC 4.7  4.0 - 10.5 K/uL   RBC 4.85  4.22 - 5.81 MIL/uL   Hemoglobin 12.4 (*) 13.0 - 17.0 g/dL   HCT 38.0 (*) 39.0 - 52.0 %   MCV 78.4  78.0 - 100.0 fL   MCH 25.6 (*) 26.0 - 34.0 pg   MCHC 32.6  30.0 - 36.0 g/dL   RDW 15.5  11.5 - 15.5 %   Platelets 151  150 - 400 K/uL   Neutrophils Relative % 48  43 - 77 %   Neutro Abs 2.2  1.7 - 7.7 K/uL   Lymphocytes Relative 37  12 - 46 %   Lymphs Abs 1.7  0.7 - 4.0 K/uL   Monocytes Relative 7  3 - 12 %   Monocytes Absolute 0.3  0.1 - 1.0 K/uL   Eosinophils Relative 8 (*) 0 - 5 %   Eosinophils Absolute 0.4  0.0 - 0.7 K/uL   Basophils Relative 0  0 - 1 %   Basophils Absolute 0.0  0.0 - 0.1 K/uL  GLUCOSE, CAPILLARY     Status: Abnormal   Collection Time    05/31/13  6:51 AM      Result Value Ref Range   Glucose-Capillary 131 (*) 70 - 99 mg/dL   Dg Chest 2 View  05/29/2013   CLINICAL DATA:  Hypertension; preoperative surgical fusion; intermittent cough  EXAM: CHEST  2 VIEW  COMPARISON:  May 25, 2012  FINDINGS: There is no edema or consolidation. Heart size and pulmonary vascularity are normal.  No adenopathy. There is postoperative change in the lower cervical spine.  IMPRESSION: No edema or consolidation.   Electronically Signed   By: Lowella Grip M.D.   On: 05/29/2013 14:58    Review of Systems  Constitutional: Negative.   HENT: Negative.   Eyes: Negative.   Respiratory: Negative.   Cardiovascular: Negative.   Gastrointestinal: Negative.   Genitourinary: Negative.   Musculoskeletal: Negative.   Skin: Negative.   Neurological: Negative.   Endo/Heme/Allergies: Negative.    Psychiatric/Behavioral: Negative.     Blood pressure 193/76, pulse 53, temperature 98 F (36.7 C), temperature source Oral, resp. rate 18, SpO2 100.00%. Physical Exam  Constitutional: He is oriented to person, place, and time. He appears well-developed and well-nourished. No distress.  HENT:  Head: Normocephalic and atraumatic.  Right Ear: External ear normal.  Left Ear: External ear normal.  Nose: Nose normal.  Mouth/Throat: Oropharynx is clear and moist.  Eyes: Conjunctivae and EOM are normal. Pupils are equal, round, and reactive to light. Right eye exhibits no discharge. Left eye exhibits no discharge.  Neck: Normal range of motion. Neck supple. No tracheal deviation present. No thyromegaly present.  Cardiovascular: Normal rate, regular rhythm, normal heart sounds and intact distal pulses.  Exam reveals no friction rub.   No murmur heard. Respiratory: Effort normal and breath sounds normal. No respiratory distress. He has no wheezes.  GI: Soft. Bowel sounds are normal. He exhibits no distension. There is no tenderness.  Neurological: He is alert and oriented to person, place, and time. He has normal reflexes. He displays normal reflexes. No cranial nerve deficit. He exhibits normal muscle tone. Coordination normal.  Skin: Skin is warm and dry. He is not diaphoretic.  Psychiatric: He has a normal mood and affect. His behavior is normal. Judgment and thought content normal.     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 Plan C5-6 and C6-7 anterior cervical discectomy and interbody cage fusion with with local autograph and anterior plate instrumentation. This will require removal of his previously placed C3-C5 instrumentation. Risks and benefits been explained. Patient wishes to proceed.  Cooper Render Reeve Mallo 05/31/2013, 7:37 AM

## 2013-05-31 NOTE — Op Note (Signed)
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 Status post C3-C5 anterior cervical fusion with instrumentation  Postoperative diagnosis: Same  Procedure Name: C5-6, C6-7 anterior cervical discectomy with interbody fusion utilizing peak cage, local autograft, and anterior plate instrumentation. Removal of C3-C5 anterior cervical plate.  Surgeon:Hyman Crossan A.Alayja Armas, M.D.  Asst. Surgeon: Vertell Limber  Anesthesia: General  Indication: 63 year old male status post previous anterior cervical decompression and fusion at C3-4 and C4-5 with good result presents now with worsening neck and bilateral R. symptoms. Workup demonstrates evidence of significant spondylosis with early stenosis at C5-6. At C6-7 the patient has a large central disc herniation with marked spinal cord compression. Patient presents now for 2 level anterior cervical decompression and fusion. This will require removing as previously placed instrumentation.  Operative note: After induction anesthesia, patient positioned supine with neck slightly extended and held in place of halter traction. Incision made overlying C5-6. This carried down sharply to the platysma. Is then divided vertically and dissection proceeded along the medial border of the sternocleidomastoid muscle and carotid sheath. Trachea and esophagus were mobilized and retracted towards the left. Prevertebral fascia stripped off the anterior spinal column. Previously placed the 3 to C5 plate was dissected free. He was disassembled. And it was removed. Fusion at C3-4 and C4-5 were directly inspected and found to be solid. Longus colli muscles elevated bilaterally head C5-6 and 7. Self-retaining traction placed. A large investing anterior osteophyte emanating from the C5 vertebral body was then resected using Leksell rongeurs and Kerrison rongeurs. Bone was used to later  autografting. The space at C5-6 and C6-7 were then incised 15 blade in a rectangular fashion. Wide disc space clear was achieved using pituitary rongeurs for tobacco progress Kerrison rongeurs the high-speed drill. All elements the disc removed down to level posterior annulus starting first at C6-7. Microscope brought field these were microdissection. Remaining aspects of annulus ossifies removed using high-speed drill down to the level of the posterior longitudinal limb. Posterior lateral and it was then elevated and resected so fashion using Kerrison rongeurs. Underlying thecal sac was identified. A wide central decompression and perform undercutting the bodies of C6 and C7. Decompression then proceeded out each neural foramen. Wide anterior foraminotomies were then performed on of course exiting C7 nerve roots bilaterally. At this point a very thorough decompression achieved. There is no his injury thecal sac or nerve roots. Procedure then repeated at C5-6. Wound is then irrigated out like solution. Gelfoam was placed topically for hemostasis. She noted the vertebral body of C7 was somewhat hypoplastic and slope away from the other bodies making the disc space at C6-7 challenging for interbody fusion. At C5-6 and 7 mm Medtronic anatomic peek cage was then impacted in place recessed roughly 1 mm from the anterior cortical margin. At C6-7-1 8 mm Medtronic anatomic peek cages packed with morselized autograft and impacted until was flush with the anterior vertebral body of C7. Fluoroscopic images were used at this point and although the posterior aspect of the cage appeared to be at the posterior margin of the vertebral bodies of C6 and C7 it did not appear to extend the canal. This was confirmed also by direct visualization of the posterior aspect of the cage and thecal sac. A 42 mm Atlantis anterior cervical plate was then placed over the C5-C6 and C7 levels. This infection or fluoroscopic guidance using 13 mm  variable angle screws. All 6 screws and  final tightening be solidly within bone. Locking screws and gauge at all 3 levels. Final images revealed good position bone graft and hardware at proper upper level with normal alignment is fine. Wound is then irrigated with and bike solution. Hemostasis was achieved with bipolar cautery. A medium Hemovac drain was left in the prevertebral space. Wounds and close in layers with Vicryl sutures. Steri-Strips triggers were applied. There were no apparent complications. Patient tolerated the procedure well and he returns to the recovery room postop.

## 2013-05-31 NOTE — Brief Op Note (Signed)
05/31/2013  10:45 AM  PATIENT:  Bruce Dyer  63 y.o. male  PRE-OPERATIVE DIAGNOSIS:  stenosis  POST-OPERATIVE DIAGNOSIS:  stenosis  PROCEDURE:  Procedure(s) with comments: ANTERIOR CERVICAL DECOMPRESSION/DISCECTOMY FUSION CERVICAL FIVE-SIX, SIX-SEVEN (N/A) - ANTERIOR CERVICAL DECOMPRESSION/DISCECTOMY FUSION CERVICAL FIVE-SIX, SIX-SEVEN  SURGEON:  Surgeon(s) and Role:    * Charlie Pitter, MD - Primary    * Erline Levine, MD - Assisting  PHYSICIAN ASSISTANT:   ASSISTANTS:    ANESTHESIA:   general  EBL:  Total I/O In: 1000 [I.V.:1000] Out: 200 [Blood:200]  BLOOD ADMINISTERED:none  DRAINS: (Medium) Hemovact drain(s) in the Prevertebral space with  Suction Open   LOCAL MEDICATIONS USED:  MARCAINE     SPECIMEN:  No Specimen  DISPOSITION OF SPECIMEN:  N/A  COUNTS:  YES  TOURNIQUET:  * No tourniquets in log *  DICTATION: .Dragon Dictation  PLAN OF CARE: Admit to inpatient   PATIENT DISPOSITION:  PACU - hemodynamically stable.   Delay start of Pharmacological VTE agent (>24hrs) due to surgical blood loss or risk of bleeding: yes

## 2013-05-31 NOTE — Progress Notes (Signed)
Notified Dr. Linna Caprice of pt's blood pressure.

## 2013-05-31 NOTE — Transfer of Care (Signed)
Immediate Anesthesia Transfer of Care Note  Patient: Bruce Dyer  Procedure(s) Performed: Procedure(s) with comments: ANTERIOR CERVICAL DECOMPRESSION/DISCECTOMY FUSION CERVICAL FIVE-SIX, SIX-SEVEN (N/A) - ANTERIOR CERVICAL DECOMPRESSION/DISCECTOMY FUSION CERVICAL FIVE-SIX, SIX-SEVEN  Patient Location: PACU  Anesthesia Type:General  Level of Consciousness: awake and alert   Airway & Oxygen Therapy: Patient Spontanous Breathing and Patient connected to face mask oxygen  Post-op Assessment: Report given to PACU RN and Post -op Vital signs reviewed and stable  Post vital signs: Reviewed and stable  Complications: No apparent anesthesia complications

## 2013-05-31 NOTE — Anesthesia Procedure Notes (Signed)
Procedure Name: Intubation Date/Time: 05/31/2013 7:54 AM Performed by: Octavio Graves Pre-anesthesia Checklist: Patient identified, Timeout performed, Emergency Drugs available, Suction available and Patient being monitored Patient Re-evaluated:Patient Re-evaluated prior to inductionOxygen Delivery Method: Circle system utilized Preoxygenation: Pre-oxygenation with 100% oxygen Intubation Type: IV induction Ventilation: Mask ventilation without difficulty Laryngoscope Size: Miller and 2 Grade View: Grade I Tube type: Oral Tube size: 7.5 mm Number of attempts: 1 Airway Equipment and Method: Stylet and LTA kit utilized Placement Confirmation: ETT inserted through vocal cords under direct vision,  positive ETCO2 and breath sounds checked- equal and bilateral Secured at: 23 cm Tube secured with: Tape Dental Injury: Teeth and Oropharynx as per pre-operative assessment  Comments: IV induction Joslin- intubation AM CRNA atraumatic- 2 lower teeth as perop- + BS Joslin

## 2013-05-31 NOTE — Progress Notes (Signed)
Orthopedic Tech Progress Note Patient Details:  Bruce Dyer 1950-02-28 [id number] Delivered to Neuro PACU desk Ortho Devices Type of Ortho Device: Soft collar Ortho Device/Splint Interventions: Ordered   [place] 05/31/2013, 9:03 AM

## 2013-05-31 NOTE — Anesthesia Preprocedure Evaluation (Addendum)
Anesthesia Evaluation  Patient identified by MRN, date of birth, ID band Patient awake    Reviewed: Allergy & Precautions, H&P , NPO status , Patient's Chart, lab work & pertinent test results  Airway Mallampati: II TM Distance: >3 FB Neck ROM: Full    Dental  (+) Edentulous Upper, Partial Lower   Pulmonary former smoker,  breath sounds clear to auscultation        Cardiovascular hypertension, Rhythm:Regular Rate:Normal     Neuro/Psych    GI/Hepatic   Endo/Other  diabetes  Renal/GU      Musculoskeletal   Abdominal (+) + obese,   Peds  Hematology   Anesthesia Other Findings   Reproductive/Obstetrics                          Anesthesia Physical Anesthesia Plan  ASA: III  Anesthesia Plan: General   Post-op Pain Management:    Induction: Intravenous  Airway Management Planned: Oral ETT  Additional Equipment:   Intra-op Plan:   Post-operative Plan:   Informed Consent: I have reviewed the patients History and Physical, chart, labs and discussed the procedure including the risks, benefits and alternatives for the proposed anesthesia with the patient or authorized representative who has indicated his/her understanding and acceptance.     Plan Discussed with: CRNA  Anesthesia Plan Comments: (Htn Type 2 DM glucose 162 CAD S/P PTCA with stent 2009, No angina, nuclear stress test yesterday (-) for ischemia with fixed Inferior wall defect. EF 46% last took plavix 5/11  )      Anesthesia Quick Evaluation

## 2013-05-31 NOTE — Anesthesia Postprocedure Evaluation (Signed)
  Anesthesia Post-op Note  Patient: Bruce Dyer  Procedure(s) Performed: Procedure(s) with comments: ANTERIOR CERVICAL DECOMPRESSION/DISCECTOMY FUSION CERVICAL FIVE-SIX, SIX-SEVEN (N/A) - ANTERIOR CERVICAL DECOMPRESSION/DISCECTOMY FUSION CERVICAL FIVE-SIX, SIX-SEVEN  Patient Location: PACU  Anesthesia Type:General  Level of Consciousness: awake, alert  and oriented  Airway and Oxygen Therapy: Patient Spontanous Breathing and Patient connected to nasal cannula oxygen  Post-op Pain: mild  Post-op Assessment: Post-op Vital signs reviewed, Patient's Cardiovascular Status Stable, Respiratory Function Stable, Patent Airway and Pain level controlled  Post-op Vital Signs: stable  Last Vitals:  Filed Vitals:   05/31/13 1253  BP: 157/76  Pulse: 79  Temp: 36.4 C  Resp: 18    Complications: No apparent anesthesia complications

## 2013-06-01 LAB — GLUCOSE, CAPILLARY: GLUCOSE-CAPILLARY: 168 mg/dL — AB (ref 70–99)

## 2013-06-01 MED ORDER — CYCLOBENZAPRINE HCL 10 MG PO TABS: 10.0000 mg | ORAL_TABLET | Freq: Three times a day (TID) | ORAL | Status: DC | PRN

## 2013-06-01 MED ORDER — HYDROCODONE-ACETAMINOPHEN 5-325 MG PO TABS: 1.0000 | ORAL_TABLET | ORAL | Status: DC | PRN

## 2013-06-01 NOTE — Discharge Summary (Signed)
Physician Discharge Summary  Patient ID: Bruce Dyer MRN: [id number] DOB/AGE: [birth year] 61 y.o.  Admit date: 05/31/2013 Discharge date: 06/01/2013  Admission Diagnoses:  Discharge Diagnoses:  Active Problems:   Cervical spinal stenosis   Discharged Condition: good  Hospital Course: Patient admitted to hospital where he underwent uncomplicated 2 level anterior cervical decompression and fusion. Postoperatively is done quite well. Preoperative neck and upper daily symptoms very much improved. Strength and sensation intact. Ambulating well. Ready for discharge home.  Consults:   Significant Diagnostic Studies:   Treatments:   Discharge Exam: Blood pressure 134/65, pulse 86, temperature 98.6 F (37 C), temperature source Oral, resp. rate 18, SpO2 100.00%. Awake and alert. Oriented and appropriate. Motor and sensory function intact. Wounds clean and dry. Chest and abdomen benign.  Disposition:      Medication List         aspirin 325 MG tablet  Take 325 mg by mouth daily.     atorvastatin 40 MG tablet  Commonly known as:  LIPITOR  Take 40 mg by mouth every evening.     clopidogrel 75 MG tablet  Commonly known as:  PLAVIX  Take 75 mg by mouth daily with breakfast.     cyclobenzaprine 10 MG tablet  Commonly known as:  FLEXERIL  Take 1 tablet (10 mg total) by mouth 3 (three) times daily as needed for muscle spasms.     esomeprazole 40 MG capsule  Commonly known as:  NEXIUM  Take 40 mg by mouth every evening.     hydrochlorothiazide 25 MG tablet  Commonly known as:  HYDRODIURIL  Take 25 mg by mouth daily.     HYDROcodone-acetaminophen 5-325 MG per tablet  Commonly known as:  NORCO/VICODIN  Take 1-2 tablets by mouth every 4 (four) hours as needed for moderate pain.     metFORMIN 500 MG tablet  Commonly known as:  GLUCOPHAGE  Take 500 mg by mouth 2 (two) times daily with a meal.     metoprolol succinate 50 MG 24 hr tablet  Commonly known as:  TOPROL-XL   Take 50 mg by mouth daily. Take with or immediately following a meal.     potassium chloride 10 MEQ tablet  Commonly known as:  K-DUR,KLOR-CON  Take 10 mEq by mouth 2 (two) times daily.     quinapril 40 MG tablet  Commonly known as:  ACCUPRIL  Take 40 mg by mouth at bedtime.     ranitidine 150 MG tablet  Commonly known as:  ZANTAC  Take 150 mg by mouth every morning.     sitaGLIPtin 100 MG tablet  Commonly known as:  JANUVIA  Take 100 mg by mouth daily.     tamsulosin 0.4 MG Caps capsule  Commonly known as:  FLOMAX  Take 0.4 mg by mouth daily after supper.     Trospium Chloride 60 MG Cp24  Take 60 mg by mouth every evening.           Follow-up Information   Schedule an appointment as soon as possible for a visit with Charlie Pitter, MD.   Specialty:  Neurosurgery   Contact information:   [address] [place] [address] [phone]       Signed: Cooper Render Willam Munford 06/01/2013, 9:14 AM

## 2013-06-01 NOTE — Discharge Instructions (Signed)
Wound Care °Keep incision area dry.  °You may remove outer bandage after 2 days and shower.  ° If you shower prior cover incision with plastic wrap.  °Do not put any creams, lotions, or ointments on incision. °Leave steri-strips on neck.  They will fall off by themselves. °Activity °Walk each and every day, increasing distance each day. °No lifting greater than 5 lbs.  Avoid excessive neck motion. °No driving for 2 weeks; may ride as a passenger locally. °Wear neck brace at all times except when showering. °Diet °Resume your normal diet.  °Return to Work °Will be discussed at you follow up appointment. °Call Your Doctor If Any of These Occur °Redness, drainage, or swelling at the wound.  °Temperature greater than 101 degrees. °Severe pain not relieved by pain medication. °Increased difficulty swallowing.  °Incision starts to come apart. °Follow Up Appt °Call today and ask for Rebecca for appointment in 1-2 weeks ([phone]) or for problems.  If you have any hardware placed in your spine, you will need an x-ray before your appointment. °Anterior Cervical Diskectomy and Fusion °Anterior cervical diskectomy is surgery done on the upper spine to relieve pressure on one or more nerve roots, or on the spinal cord. There are 7 bones in your neck, called the cervical spine. These 7 bones (vertebrae) sit one on top of the other. Cushions (intervertebral disks) separate the vertebrae and act like shock absorbers. As we age, degeneration of our bones, joints, and disks can cause neck pain and tightening around the spinal cord and nerve roots. This causes arm pain and weakness.  °Degeneration involves: °· Herniated Disk. With age, the disks dry up and can rupture. In this condition, the center of the disk bulges out (disk herniation). This can cause pressure on a nerve, which produces pain or weakness in the arm. °· Bone spurs and spinal stenosis. As we age, growths often develop on our bones. These growths are called bone spurs  (osteophytes). A bone spur is a collection of calcium. As bone spurs grow and extend, the vertebral openings become narrow. The spinal canal and/or the foramen (opening for nerve passageways) become smaller. This narrowing (stenosis) may cause pinching (compression) of the spinal cord or the spinal nerve root. The nerve injury can cause pain, weakness, numbness, and loss of coordination in the upper limbs. Often, patients have difficulty with their hand writing or they start dropping things, because their hand grip is weaker. The spinal cord damage can cause increased stiffness, more frequent falls, electric shooting pain, and changes in bowel and bladder control. °Degeneration in the neck results in three common problems: °· Radiculopathy - Nerve compression that results in weakness or pain that radiates down the arm. °· Myelopathy - Spinal cord compression that causes stiffness, difficulty with walking, coordination, and trouble with bowel or bladder habits. °· Neck pain - Worn out joints cause pain as the neck moves. °Treatment: °· Radiculopathy - Surgery is performed to remove the bony and disk material that is pushing on the nerve. °· Myelopathy - Surgery is performed to remove the bony and disk material that pushes on the spinal cord. °· Neck pain - Surgery is performed to combine (fuse) the joints of the neck together, so they cannot move or cause pain. °Surgery can be done from the front or the back of the neck. When it is done from the front, it is called an anterior (front) cervical (neck) diskectomy (removal of the disk) and fusion. °LET YOUR CAREGIVER KNOW ABOUT:  °·   Recent infections. °· Any shooting pains down your leg, when you move your neck. °· Any difficulty swallowing. °· A smoking history. °· Use of blood thinners or anti-inflammatory medicines. °· Any history of injury to your shoulders. °· Any history of injury to your vocal cords. °· Any foreign objects in your body from a previous  surgery. °· Any recent fevers or illness. °· Past medical history (diabetes, strokes). °· Past problems with anesthetics. °· Possibility of pregnancy. °· History of blood clots (deep vein thrombosis). °· History of bleeding or blood problems. °· Past surgeries. °· Other health problems. °· Allergies. °· Medicines you take, including herbs, eye drops, over-the-counter medicines, and creams. °· Use of steroids (by mouth or creams). °RISKS AND COMPLICATIONS °· Infection. °· Bleeding. °· Injury to the following structures: °· Carotid artery. This can result in a stroke or significant amount of bleeding. °· Esophagus, resulting in difficulty swallowing. °· Recurrent laryngeal nerve, resulting in hoarseness of the voice. °· Spinal cord injury, ranging from mild to complete quadriparesis (muscle weakness in all four limbs). °· Nerve root injury, resulting in muscle weakness in the upper limb. °· Leakage of cerebrospinal fluid. °BEFORE THE PROCEDURE  °· You will be given medicine to help you sleep (general anesthetic), and a breathing tube will be placed. °· You will be given antibiotics to keep the infection rate down. °· The incision site on your neck will be marked. °· Your neck will be cleaned, to reduce the risk of infection. °PROCEDURE  °An anterior cervical fusion means that the operation is done through the front (anterior) part of your neck. The cut made by the surgeon (incision) is usually within a skin fold line on the neck. After pushing aside the neck muscles, the surgeon removes the affected, degenerated disk and bone spurs (osteophytes), which takes the pressure off the nerves and spinal cord. This is called a decompression. The area where the disk was removed is then filled with a small piece of plastic. This plastic takes the place of the disk and keeps the nerve passageway (foramen) open and clear for the nerves. In most cases, the surgeon uses metal plates or pins (hardware) in the neck, to help stabilize  the level being fused. The hardware reduces motion at that level, so it can fuse. This provides extra support to the neck. A cervical fusion procedure takes anywhere from a couple to several hours, depending on the size of the neck, history of previous surgery, and number of levels being fused. °AFTER THE PROCEDURE  °· You will likely spend 24 48 hours in the hospital. During this time, your caregivers will look for any signs of complications from the procedure. °· Your caregiver will watch you, to make sure that fluid draining from the surgery slows down. It is important that a large mass of blood does not form in your neck, which would cause difficulty with breathing. °· You will get 24 hours of antibiotics. °· You can start to eat as soon as you feel comfortable. °· Once you have started eating, walking, urinating (voiding) and having bowel movements on your own, your caregiver will discharge you home. °HOME CARE INSTRUCTIONS  °· For 2 weeks, do not soak the incision site under water. Do not swim or take baths. Showers are okay, but rinse off the incision sites. °· Do not over exert yourself. Allow time for the incision to heal. °· It can take from 6 weeks to 6 months for fusion to take effect.   Your caregiver may ask you to wear a neck collar during this time, as they check the fusion with multiple (serial) X-rays. °Document Released: 12/22/2008 Document Revised: 04/30/2012 Document Reviewed: 12/22/2008 °ExitCare® Patient Information ©2014 [employer]. ° °

## 2013-06-01 NOTE — Progress Notes (Signed)
Pt given D/C instructions with Rx's, verbal understanding was given. Pt's IV and Hemovac was removed prior to D/C. Pt D/C'd home via wheelchair with wife per MD order. Pt is stable @ D/C and has no other needs. Holli Humbles, RN

## 2013-06-04 ENCOUNTER — Encounter (HOSPITAL_COMMUNITY): Payer: Self-pay | Admitting: Neurosurgery

## 2013-07-12 NOTE — OR Nursing (Signed)
Addendum to scope page 

## 2014-12-02 ENCOUNTER — Inpatient Hospital Stay (HOSPITAL_COMMUNITY): Admission: RE | Admit: 2014-12-02 | Payer: Self-pay | Source: Ambulatory Visit | Admitting: Neurosurgery

## 2014-12-02 ENCOUNTER — Encounter (HOSPITAL_COMMUNITY): Admission: RE | Payer: Self-pay | Source: Ambulatory Visit

## 2014-12-02 SURGERY — POSTERIOR CERVICAL FUSION/FORAMINOTOMY LEVEL 3
Anesthesia: General

## 2015-03-02 ENCOUNTER — Other Ambulatory Visit (HOSPITAL_COMMUNITY): Payer: Self-pay | Admitting: Neurosurgery

## 2015-04-03 NOTE — Pre-Procedure Instructions (Signed)
Isileli Gatson Robillard  04/03/2015      [id number] [place],  - [address], [address] AT [facility] [address] [place], [address] [place] [address] Phone: [phone] Fax: [phone]    Your procedure is scheduled on : Monday, March 27th           Report to [facility] Admitting at 7:30 AM             (posted surgery time 9:30 - 11:00 am)   Call this number if you have problems the morning of surgery:  [phone]   Remember:  Do not eat food or drink liquids after midnight Sunday.  Take these medicines the morning of surgery with A SIP OF WATER : Nexium, Norco, Metoprolol, Flomax.              DO NOT take any diabetes medications the morning of surgery.   Do not wear jewelry - no rings or watches.  Do not wear lotions or colognes.     You may NOT wear deodorant the day of surgery.              Men may shave face and neck.  Do not bring valuables to the hospital.  [facility] is not responsible for any belongings or valuables.  Contacts, dentures or bridgework may not be worn into surgery.  Leave your suitcase in the car.  After surgery it may be brought to your room. For patients admitted to the hospital, discharge time will be determined by your treatment team.  Name and phone number of your driver:     Please read over the following fact sheets that you were given. Pain Booklet, Coughing and Deep Breathing, Blood Transfusion Information, MRSA Information and Surgical Site Infection Prevention

## 2015-04-06 ENCOUNTER — Encounter (HOSPITAL_COMMUNITY): Payer: Self-pay

## 2015-04-06 ENCOUNTER — Encounter (HOSPITAL_COMMUNITY)
Admission: RE | Admit: 2015-04-06 | Discharge: 2015-04-06 | Disposition: A | Discharge status: Home / Self Care | Payer: BLUE CROSS/BLUE SHIELD | Source: Ambulatory Visit | Attending: Neurosurgery | Admitting: Neurosurgery

## 2015-04-06 DIAGNOSIS — Z01818 Encounter for other preprocedural examination: Secondary | ICD-10-CM | POA: Insufficient documentation

## 2015-04-06 DIAGNOSIS — Z87891 Personal history of nicotine dependence: Secondary | ICD-10-CM | POA: Insufficient documentation

## 2015-04-06 DIAGNOSIS — Z7982 Long term (current) use of aspirin: Secondary | ICD-10-CM | POA: Insufficient documentation

## 2015-04-06 DIAGNOSIS — Z85038 Personal history of other malignant neoplasm of large intestine: Secondary | ICD-10-CM | POA: Diagnosis not present

## 2015-04-06 DIAGNOSIS — Z955 Presence of coronary angioplasty implant and graft: Secondary | ICD-10-CM | POA: Insufficient documentation

## 2015-04-06 DIAGNOSIS — Z7902 Long term (current) use of antithrombotics/antiplatelets: Secondary | ICD-10-CM | POA: Insufficient documentation

## 2015-04-06 DIAGNOSIS — I2510 Atherosclerotic heart disease of native coronary artery without angina pectoris: Secondary | ICD-10-CM | POA: Diagnosis not present

## 2015-04-06 DIAGNOSIS — I10 Essential (primary) hypertension: Secondary | ICD-10-CM | POA: Insufficient documentation

## 2015-04-06 DIAGNOSIS — E119 Type 2 diabetes mellitus without complications: Secondary | ICD-10-CM | POA: Insufficient documentation

## 2015-04-06 DIAGNOSIS — Z01812 Encounter for preprocedural laboratory examination: Secondary | ICD-10-CM | POA: Diagnosis not present

## 2015-04-06 DIAGNOSIS — Z79899 Other long term (current) drug therapy: Secondary | ICD-10-CM | POA: Diagnosis not present

## 2015-04-06 DIAGNOSIS — K219 Gastro-esophageal reflux disease without esophagitis: Secondary | ICD-10-CM | POA: Insufficient documentation

## 2015-04-06 DIAGNOSIS — Z0183 Encounter for blood typing: Secondary | ICD-10-CM | POA: Diagnosis not present

## 2015-04-06 DIAGNOSIS — Z7984 Long term (current) use of oral hypoglycemic drugs: Secondary | ICD-10-CM | POA: Insufficient documentation

## 2015-04-06 HISTORY — DX: Atherosclerotic heart disease of native coronary artery without angina pectoris: I25.10

## 2015-04-06 LAB — CBC WITH DIFFERENTIAL/PLATELET
BASOS ABS: 0 10*3/uL (ref 0.0–0.1)
BASOS PCT: 1 %
EOS PCT: 7 %
Eosinophils Absolute: 0.4 10*3/uL (ref 0.0–0.7)
HCT: 39.4 % (ref 39.0–52.0)
Hemoglobin: 12.9 g/dL — ABNORMAL LOW (ref 13.0–17.0)
Lymphocytes Relative: 37 %
Lymphs Abs: 2.1 10*3/uL (ref 0.7–4.0)
MCH: 26.3 pg (ref 26.0–34.0)
MCHC: 32.7 g/dL (ref 30.0–36.0)
MCV: 80.2 fL (ref 78.0–100.0)
Monocytes Absolute: 0.4 10*3/uL (ref 0.1–1.0)
Monocytes Relative: 7 %
Neutro Abs: 2.8 10*3/uL (ref 1.7–7.7)
Neutrophils Relative %: 48 %
PLATELETS: 194 10*3/uL (ref 150–400)
RBC: 4.91 MIL/uL (ref 4.22–5.81)
RDW: 15.2 % (ref 11.5–15.5)
WBC: 5.7 10*3/uL (ref 4.0–10.5)

## 2015-04-06 LAB — TYPE AND SCREEN
ABO/RH(D): A POS
Antibody Screen: NEGATIVE

## 2015-04-06 LAB — BASIC METABOLIC PANEL
ANION GAP: 12 (ref 5–15)
BUN: 19 mg/dL (ref 6–20)
CO2: 22 mmol/L (ref 22–32)
Calcium: 9.3 mg/dL (ref 8.9–10.3)
Chloride: 107 mmol/L (ref 101–111)
Creatinine, Ser: 1.83 mg/dL — ABNORMAL HIGH (ref 0.61–1.24)
GFR calc Af Amer: 44 mL/min — ABNORMAL LOW (ref >60)
GFR, EST NON AFRICAN AMERICAN: 38 mL/min — AB (ref >60)
Glucose, Bld: 139 mg/dL — ABNORMAL HIGH (ref 65–99)
Potassium: 3.6 mmol/L (ref 3.5–5.1)
SODIUM: 141 mmol/L (ref 135–145)

## 2015-04-06 LAB — SURGICAL PCR SCREEN
MRSA, PCR: NEGATIVE
Staphylococcus aureus: POSITIVE — AB

## 2015-04-06 LAB — GLUCOSE, CAPILLARY: Glucose-Capillary: 117 mg/dL — ABNORMAL HIGH (ref 65–99)

## 2015-04-06 NOTE — Progress Notes (Signed)
   04/06/15 1009  OBSTRUCTIVE SLEEP APNEA  Have you ever been diagnosed with sleep apnea through a sleep study? No  Do you snore loudly (loud enough to be heard through closed doors)?  1  Do you often feel tired, fatigued, or sleepy during the daytime (such as falling asleep during driving or talking to someone)? 1 (TAKES POWER NAPS AROUND 2 PM)  Has anyone observed you stop breathing during your sleep? 0  Do you have, or are you being treated for high blood pressure? 1  BMI more than 35 kg/m2? 1  Age > 50 (1-yes) 1  Neck circumference greater than:Male 16 inches or larger, Male 17inches or larger? 1  Male Gender (Yes=1) 1  Obstructive Sleep Apnea Score 7  Score 5 or greater  Results sent to PCP

## 2015-04-06 NOTE — Progress Notes (Addendum)
PCP is Dr. Drake Leach @ [facility] in [facility]  [phone]  LOV 01/2015 Cardio is Dr. Assunta Curtis of [facility] in [facility].  [place] 08/2014  Am requesting any cardiac notes, tests and ekg.  H/o PAF per the records. Had heart cath back in 2009 with stent placed.  He informed me that "awhile ago" he got dizzy, went to either hospital or his doctors, and they determined that he may have been dehydrated..."magnesium level was low ".   Has had no recurrent episode since, denies any chest pains or sob.  He states his sugars run 120-130 range in the mornings.

## 2015-04-07 LAB — HEMOGLOBIN A1C
HEMOGLOBIN A1C: 6.5 % — AB (ref 4.8–5.6)
Mean Plasma Glucose: 140 mg/dL

## 2015-04-08 ENCOUNTER — Encounter (HOSPITAL_COMMUNITY): Payer: Self-pay | Admitting: Anesthesiology

## 2015-04-08 ENCOUNTER — Encounter (HOSPITAL_COMMUNITY): Payer: Self-pay | Admitting: Vascular Surgery

## 2015-04-08 NOTE — Progress Notes (Addendum)
Anesthesia Chart Review: Patient is a 65 year old male scheduled for posterior cervical fusion with lateral mass fixation, C5-6, C6-7 right; C5-6 foraminotomy on 03/2715 by Dr. Annette Stable.   History includes former smoker, HTN, irregular hear beat, GERD, colon cancer s/p surgery '13, DM2, CAD s/p CX stent with unsuccessful PCI OM3 (occluded) 05/2009 ([place]), arthritis, BPH (Dr. Venia Minks). BMI is consistent with obesity. OSA screening score was elevated at 7.  PCP is Dr. Drake Leach with [facility] [place], [facility]). Last visit 02/17/15 for HTN, palpitations, episode of chest pain relieved with Nitro and though it might fell the same as before he had a stent. EKG did no show ischemic changes, but he recommended continue with Nitro PRN with "GI" [presumed he meant cardiology] for possible cardiac cath. It does not appear that patient had subsequent cardiology follow-up. (I was not asked to see patient at PAT. Apparently, patient denied chest pain and SOB then. Only reported dizziness "awhile ago" that was felt related to dehydration and low magnesium.)  Cardiologist is Dr. Wyline Copas with [facility] in [facility]. Last visit with Chesley Mires, PA-C on 12/30/14. Reported "very sporadic" sharp chest pains with activity or with rest at that time. He had a stress test in 2015, so no new testing ordered.  Meds include allopurinol, ASA, Lipitor, chlorthalidone, clomipramine, Plavix, Nexium, HCTZ, KCl, metformin, Toprol, Nitro, quinapril, Zantac, Januvia, Flomax. Per PAT RN, patient reported being instructed to hold Plavix pre-operatively.   PAT Vitals: BP 117/58, HR 83, RR 20, T 36.6C, O2 sat 98%. CBG 117.  02/17/15 EKG (Dr. Sallyanne Havers): SR with PACs with aberrant conduction (tracing requested). 12/30/14 EKG (Dr. Earlie Counts): SR, occasional PACs.  05/30/13 Nuclear stress test ([facility]; scanned under Media tab, Correspondence 05/31/13): IMPRESSION: No evidence of  pharmacological induced ischemia. Fixed defect involving the inferolateral wall along the mid and basilar segments. An area of infarct cannot be excluded based on the hypokinesia in this area. Calculated EF is 46%.  05/20/09 Cardiac cath ([facility]; scanned under Media tab, Correspondence 05/31/13; done for abnormal stress test showing inferolateral ischemia): LM and LAD normal. 80% mid-distal CX. 30% proximal RCA. Summary: 1. Severe mid distal CX disease. 2. Mid OM3 total occluded. 3. LV gram not done. Intervention: Successful PCI/Xience DES to distal CX. Mid OM3 appears to be total occluded, unable to wire through.   03/02/05 Echo ([facility]; scanned under Media tab, Correspondence 05/31/13): Normal LVF, EF 50-55%. Mild LVH. Minimal MR.   Preoperative labs noted. BUN 19, Cr 1.83, GFR 44 (previously Cr 1.22-1.50, BUN 14-15, GFR 49-58 since 09/2014 in [place]). Glucose 139. H/H 12.9/39.3. A1c 6.5%. T&S done. I will forward lab results to Dr. Sallyanne Havers and Dr. Wyline Copas.   Above reviewed, including last office note from Dr. Sallyanne Havers, reviewed with anesthesiologist Dr. Jenita Seashore. Recommend pre-operative cardiology evaluation to determine if additional testing is warranted prior to surgery. Jessica at Dr. Marchelle Folks office notified. She will contact cardiology to schedule follow-up. Proceeding as scheduled will depend on cardiology recommendations.  George Hugh [facility] Short Stay Center/Anesthesiology Phone [phone] 04/08/2015 1:48 PM  Addendum: I received a cardiac clearance note from Dr. Wyline Copas faxed by Dr. Marchelle Folks office. In [place], it says "Reviewed with Chiu--Pt may procede with surgery with low risk." There was no office visit, so Dr. Rhetta Mura office contacted this morning to ensure he was aware that patient had reported chest pain symptoms at his 12/30/14 cardiology visit and 02/17/15 PCP visit. Unfortunately, Dr. Wyline Copas was  not in the office today to confirm, so staff contacted patient today to  review any cardiac symptoms. He reportedly described symptoms more as two episodes of dizziness associated with chest fluttering since December. Symptoms resolved within a minute. Episodes were similar to what he had already spoken with Chesley Mires, PA-C about at his 12/2014. Per nurse [place] at Dr. Rhetta Mura office, this was reviewed with Chesley Mires, PA-C who reaffirmed that patient still has cardiac clearance for planned procedure with new preoperative testing recommended.   Discussed with anesthesiologist Dr. Linna Caprice. He will try to assign himself to this case. Further evaluation on the day of surgery. We reassess symptomology. If no worrisome CV symptoms then would plan to proceed; however, if still some concerns then surgery would delayed until he could have cardiology re-evaluation. I have updated Lorriane Shire at Dr. Marchelle Folks office regarding the proposed plan.  George Hugh [facility] Short Stay Center/Anesthesiology Phone [phone] 04/10/2015 3:30 PM

## 2015-04-12 MED ORDER — DEXAMETHASONE SODIUM PHOSPHATE 10 MG/ML IJ SOLN: 10.0000 mg | INTRAMUSCULAR | Status: DC

## 2015-04-12 MED ORDER — CEFAZOLIN SODIUM-DEXTROSE 2-4 GM/100ML-% IV SOLN: 2.0000 g | INTRAVENOUS | Status: DC

## 2015-04-13 ENCOUNTER — Inpatient Hospital Stay (HOSPITAL_COMMUNITY): Admission: RE | Admit: 2015-04-13 | Payer: Medicare Other | Source: Ambulatory Visit | Admitting: Neurosurgery

## 2015-04-13 ENCOUNTER — Encounter (HOSPITAL_COMMUNITY): Payer: Self-pay | Admitting: Anesthesiology

## 2015-04-13 ENCOUNTER — Encounter (HOSPITAL_COMMUNITY): Admission: RE | Payer: Self-pay | Source: Ambulatory Visit

## 2015-04-13 SURGERY — POSTERIOR CERVICAL FUSION/FORAMINOTOMY LEVEL 2
Anesthesia: General | Laterality: Right

## 2015-04-13 MED ORDER — FENTANYL CITRATE (PF) 250 MCG/5ML IJ SOLN
INTRAMUSCULAR | Status: AC
  Filled 2015-04-13: qty 5

## 2015-04-13 MED ORDER — ROCURONIUM BROMIDE 50 MG/5ML IV SOLN
INTRAVENOUS | Status: AC
  Filled 2015-04-13: qty 1

## 2015-04-13 MED ORDER — SUCCINYLCHOLINE CHLORIDE 20 MG/ML IJ SOLN
INTRAMUSCULAR | Status: AC
  Filled 2015-04-13: qty 1

## 2015-04-13 MED ORDER — ONDANSETRON HCL 4 MG/2ML IJ SOLN
INTRAMUSCULAR | Status: AC
  Filled 2015-04-13: qty 2

## 2015-04-13 MED ORDER — EPHEDRINE SULFATE 50 MG/ML IJ SOLN
INTRAMUSCULAR | Status: AC
  Filled 2015-04-13: qty 1

## 2015-04-13 MED ORDER — SODIUM CHLORIDE 0.9 % IJ SOLN
INTRAMUSCULAR | Status: AC
  Filled 2015-04-13: qty 10

## 2015-04-13 MED ORDER — PHENYLEPHRINE 40 MCG/ML (10ML) SYRINGE FOR IV PUSH (FOR BLOOD PRESSURE SUPPORT)
PREFILLED_SYRINGE | INTRAVENOUS | Status: AC
  Filled 2015-04-13: qty 10

## 2015-04-13 MED ORDER — MIDAZOLAM HCL 2 MG/2ML IJ SOLN
INTRAMUSCULAR | Status: AC
  Filled 2015-04-13: qty 2

## 2015-04-13 MED ORDER — PROPOFOL 10 MG/ML IV BOLUS
INTRAVENOUS | Status: AC
  Filled 2015-04-13: qty 20

## 2015-04-13 MED ORDER — LIDOCAINE HCL (CARDIAC) 20 MG/ML IV SOLN
INTRAVENOUS | Status: AC
  Filled 2015-04-13: qty 5

## 2015-10-21 ENCOUNTER — Other Ambulatory Visit: Payer: Self-pay | Admitting: Neurosurgery

## 2015-11-09 NOTE — Pre-Procedure Instructions (Signed)
Deacon Nicolosi Leighton  11/09/2015      [id number] - [place], [place] - [address], [address] AT [facility] [address] [place], [address] [place] [address] Phone: [phone] Fax: [phone]    Your procedure is scheduled on October 31  Report to [place] at 0600 A.M.  Call this number if you have problems the morning of surgery:  [phone]   Remember:  Do not eat food or drink liquids after midnight.   Take these medicines the morning of surgery with A SIP OF WATER allopurinol (ZYLOPRIM),metoprolol succinate (TOPROL-XL),   nitroGLYCERIN (NITROSTAT) If needed,   pantoprazole (PROTONIX), sertraline (ZOLOFT, tamsulosin (FLOMAX)  7 days prior to surgery STOP taking any Aspirin, Aleve, Naproxen, Ibuprofen, Motrin, Advil, Goody's, BC's, all herbal medications, fish oil, and all vitamins     WHAT DO I DO ABOUT MY DIABETES MEDICATION?    Marland Kitchen Do not take oral diabetes medicines (pills) the morning of surgery .METFORMIN and JANUVIA   How to Manage Your Diabetes Before and After Surgery  Why is it important to control my blood sugar before and after surgery? . Improving blood sugar levels before and after surgery helps healing and can limit problems. . A way of improving blood sugar control is eating a healthy diet by: o  Eating less sugar and carbohydrates o  Increasing activity/exercise o  Talking with your doctor about reaching your blood sugar goals . High blood sugars (greater than 180 mg/dL) can raise your risk of infections and slow your recovery, so you will need to focus on controlling your diabetes during the weeks before surgery. . Make sure that the doctor who takes care of your diabetes knows about your planned surgery including the date and location.  How do I manage my blood sugar before surgery? . Check your blood sugar at least 4 times a day, starting 2 days before surgery, to make sure that the  level is not too high or low. o Check your blood sugar the morning of your surgery when you wake up and every 2 hours until you get to the Short Stay unit. . If your blood sugar is less than 70 mg/dL, you will need to treat for low blood sugar: o Do not take insulin. o Treat a low blood sugar (less than 70 mg/dL) with  cup of clear juice (cranberry or apple), 4 glucose tablets, OR glucose gel. o Recheck blood sugar in 15 minutes after treatment (to make sure it is greater than 70 mg/dL). If your blood sugar is not greater than 70 mg/dL on recheck, call [phone] for further instructions. . Report your blood sugar to the short stay nurse when you get to Short Stay.  . If you are admitted to the hospital after surgery: o Your blood sugar will be checked by the staff and you will probably be given insulin after surgery (instead of oral diabetes medicines) to make sure you have good blood sugar levels. o The goal for blood sugar control after surgery is 80-180 mg/dL.      Do not wear jewelry  Do not wear lotions, powders, or cologne, or deoderant.  Men may shave face and neck.  Do not bring valuables to the hospital.  [facility] is not responsible for any belongings or valuables.  Contacts, dentures or bridgework may not be worn into surgery.  Leave your suitcase in the car.  After surgery it may be brought  to your room.  For patients admitted to the hospital, discharge time will be determined by your treatment team.  Patients discharged the day of surgery will not be allowed to drive home.    Special instructions:   [place]- Preparing For Surgery  Before surgery, you can play an important role. Because skin is not sterile, your skin needs to be as free of germs as possible. You can reduce the number of germs on your skin by washing with CHG (chlorahexidine gluconate) Soap before surgery.  CHG is an antiseptic cleaner which kills germs and bonds with the skin to continue killing  germs even after washing.  Please do not use if you have an allergy to CHG or antibacterial soaps. If your skin becomes reddened/irritated stop using the CHG.  Do not shave (including legs and underarms) for at least 48 hours prior to first CHG shower. It is OK to shave your face.  Please follow these instructions carefully.   1. Shower the NIGHT BEFORE SURGERY and the MORNING OF SURGERY with CHG.   2. If you chose to wash your hair, wash your hair first as usual with your normal shampoo.  3. After you shampoo, rinse your hair and body thoroughly to remove the shampoo.  4. Use CHG as you would any other liquid soap. You can apply CHG directly to the skin and wash gently with a scrungie or a clean washcloth.   5. Apply the CHG Soap to your body ONLY FROM THE NECK DOWN.  Do not use on open wounds or open sores. Avoid contact with your eyes, ears, mouth and genitals (private parts). Wash genitals (private parts) with your normal soap.  6. Wash thoroughly, paying special attention to the area where your surgery will be performed.  7. Thoroughly rinse your body with warm water from the neck down.  8. DO NOT shower/wash with your normal soap after using and rinsing off the CHG Soap.  9. Pat yourself dry with a CLEAN TOWEL.   10. Wear CLEAN PAJAMAS   11. Place CLEAN SHEETS on your bed the night of your first shower and DO NOT SLEEP WITH PETS.    Day of Surgery: Do not apply any deodorants/lotions. Please wear clean clothes to the hospital/surgery center.      Please read over the following fact sheets that you were given.

## 2015-11-10 ENCOUNTER — Encounter (HOSPITAL_COMMUNITY): Payer: Self-pay

## 2015-11-10 ENCOUNTER — Encounter (HOSPITAL_COMMUNITY)
Admission: RE | Admit: 2015-11-10 | Discharge: 2015-11-10 | Disposition: A | Discharge status: Home / Self Care | Payer: BLUE CROSS/BLUE SHIELD | Source: Ambulatory Visit | Attending: Neurosurgery | Admitting: Neurosurgery

## 2015-11-10 DIAGNOSIS — Z7982 Long term (current) use of aspirin: Secondary | ICD-10-CM | POA: Diagnosis not present

## 2015-11-10 DIAGNOSIS — K219 Gastro-esophageal reflux disease without esophagitis: Secondary | ICD-10-CM | POA: Insufficient documentation

## 2015-11-10 DIAGNOSIS — I2510 Atherosclerotic heart disease of native coronary artery without angina pectoris: Secondary | ICD-10-CM | POA: Diagnosis not present

## 2015-11-10 DIAGNOSIS — E119 Type 2 diabetes mellitus without complications: Secondary | ICD-10-CM | POA: Diagnosis not present

## 2015-11-10 DIAGNOSIS — I10 Essential (primary) hypertension: Secondary | ICD-10-CM | POA: Insufficient documentation

## 2015-11-10 DIAGNOSIS — Z79899 Other long term (current) drug therapy: Secondary | ICD-10-CM | POA: Diagnosis not present

## 2015-11-10 DIAGNOSIS — Z0183 Encounter for blood typing: Secondary | ICD-10-CM | POA: Diagnosis not present

## 2015-11-10 DIAGNOSIS — Z87891 Personal history of nicotine dependence: Secondary | ICD-10-CM | POA: Diagnosis not present

## 2015-11-10 DIAGNOSIS — Z7984 Long term (current) use of oral hypoglycemic drugs: Secondary | ICD-10-CM | POA: Insufficient documentation

## 2015-11-10 DIAGNOSIS — M1990 Unspecified osteoarthritis, unspecified site: Secondary | ICD-10-CM | POA: Insufficient documentation

## 2015-11-10 DIAGNOSIS — M960 Pseudarthrosis after fusion or arthrodesis: Secondary | ICD-10-CM | POA: Insufficient documentation

## 2015-11-10 DIAGNOSIS — Z85038 Personal history of other malignant neoplasm of large intestine: Secondary | ICD-10-CM | POA: Insufficient documentation

## 2015-11-10 DIAGNOSIS — Z7902 Long term (current) use of antithrombotics/antiplatelets: Secondary | ICD-10-CM | POA: Insufficient documentation

## 2015-11-10 DIAGNOSIS — Z01812 Encounter for preprocedural laboratory examination: Secondary | ICD-10-CM | POA: Diagnosis not present

## 2015-11-10 DIAGNOSIS — Z01818 Encounter for other preprocedural examination: Secondary | ICD-10-CM | POA: Insufficient documentation

## 2015-11-10 DIAGNOSIS — Z955 Presence of coronary angioplasty implant and graft: Secondary | ICD-10-CM | POA: Insufficient documentation

## 2015-11-10 HISTORY — DX: Adverse effect of unspecified anesthetic, initial encounter: T41.45XA

## 2015-11-10 HISTORY — DX: Gout, unspecified: M10.9

## 2015-11-10 HISTORY — DX: Other complications of anesthesia, initial encounter: T88.59XA

## 2015-11-10 LAB — CBC WITH DIFFERENTIAL/PLATELET
BASOS PCT: 1 %
Basophils Absolute: 0 10*3/uL (ref 0.0–0.1)
EOS ABS: 0.4 10*3/uL (ref 0.0–0.7)
EOS PCT: 8 %
HCT: 33.9 % — ABNORMAL LOW (ref 39.0–52.0)
HEMOGLOBIN: 10.8 g/dL — AB (ref 13.0–17.0)
LYMPHS ABS: 1.5 10*3/uL (ref 0.7–4.0)
Lymphocytes Relative: 33 %
MCH: 25.9 pg — AB (ref 26.0–34.0)
MCHC: 31.9 g/dL (ref 30.0–36.0)
MCV: 81.3 fL (ref 78.0–100.0)
MONO ABS: 0.4 10*3/uL (ref 0.1–1.0)
MONOS PCT: 9 %
NEUTROS PCT: 49 %
Neutro Abs: 2.3 10*3/uL (ref 1.7–7.7)
PLATELETS: 172 10*3/uL (ref 150–400)
RBC: 4.17 MIL/uL — ABNORMAL LOW (ref 4.22–5.81)
RDW: 16.3 % — AB (ref 11.5–15.5)
WBC: 4.7 10*3/uL (ref 4.0–10.5)

## 2015-11-10 LAB — SURGICAL PCR SCREEN
MRSA, PCR: NEGATIVE
Staphylococcus aureus: NEGATIVE

## 2015-11-10 LAB — BASIC METABOLIC PANEL
Anion gap: 7 (ref 5–15)
BUN: 11 mg/dL (ref 6–20)
CALCIUM: 8.7 mg/dL — AB (ref 8.9–10.3)
CHLORIDE: 107 mmol/L (ref 101–111)
CO2: 25 mmol/L (ref 22–32)
CREATININE: 1.42 mg/dL — AB (ref 0.61–1.24)
GFR calc non Af Amer: 51 mL/min — ABNORMAL LOW (ref >60)
GFR, EST AFRICAN AMERICAN: 59 mL/min — AB (ref >60)
GLUCOSE: 87 mg/dL (ref 65–99)
Potassium: 3.6 mmol/L (ref 3.5–5.1)
Sodium: 139 mmol/L (ref 135–145)

## 2015-11-10 LAB — TYPE AND SCREEN
ABO/RH(D): A POS
ANTIBODY SCREEN: NEGATIVE

## 2015-11-10 LAB — GLUCOSE, CAPILLARY: GLUCOSE-CAPILLARY: 112 mg/dL — AB (ref 65–99)

## 2015-11-10 NOTE — Progress Notes (Signed)
PCP - Drake Leach Cardiologist - Wyline Copas - requesting cardiology records  Chest x-ray - not needed EKG - requesting Stress Test - requesting ECHO - requesting Cardiac Cath - requesting  Will send to anesthesia for review of cardiology records.  Bruce Dyer does have a stent placed.       Bruce Dyer denies shortness of breath, fever, cough and chest pain at PAT appointment

## 2015-11-11 LAB — HEMOGLOBIN A1C
Hgb A1c MFr Bld: 5.8 % — ABNORMAL HIGH (ref 4.8–5.6)
MEAN PLASMA GLUCOSE: 120 mg/dL

## 2015-11-11 NOTE — Progress Notes (Addendum)
Anesthesia Chart Review:   Patient is a 65 year old male scheduled for posterior cervical fusion with lateral mass fixation, C5-6, C6-7 right; C5-6 foraminotomy on 11/17/15 03/2715 by Dr. Annette Stable.   Surgery was originally scheduled for 04/13/15. It is not clear why it was postponed.   - PCP is Dr. Drake Leach with [facility], last office visit 06/18/15 (notes in care everywhere).  - Cardiologist is Dr. Wyline Copas with [facility] in [facility], last office visit 05/20/15 (notes in care everywhere).   History includes former smoker, HTN, irregular hear beat, GERD, colon cancer s/p surgery 2013, DM2, CAD (s/p CX stent with unsuccessful PCI OM3 (occluded) 05/2009 ([place])), arthritis, BPH (Dr. Venia Minks). BMI 34.  S/p ACDF 05/31/13.   BP (!) 165/64   Pulse (!) 57   Temp 36.7 C (Oral)   Resp 20   Ht 5\' 9"  (1.753 m)   Wt 229 lb 1 oz (103.9 kg)   SpO2 100%   BMI 33.83 kg/m    Meds include allopurinol, ASA, Lipitor, chlorthalidone, Plavix, protonix, KCl, metformin, Toprol, Nitro, quinapril, Januvia, Flomax.   Preoperative labs reviewed.  HgbA1c 5.8, glucose 87.    EKG 12/30/14: sinus rhythm. Occasional PACs.    05/30/13 Nuclear stress test ([facility]; scanned under Media tab, Correspondence 05/31/13): No evidence of pharmacological induced ischemia. Fixed defect involving the inferolateral wall along the mid and basilar segments. An area of infarct cannot be excluded based on the hypokinesia in this area. Calculated EF is 46%.  05/20/09 Cardiac cath ([facility]PR; scanned under Media tab, Correspondence 05/31/13; done for abnormal stress test showing inferolateral ischemia): LM and LAD normal. 80% mid-distal CX. 30% proximal RCA. Summary: 1. Severe mid distal CX disease. 2. Mid OM3 total occluded. 3. LV gram not done. Intervention: Successful PCI/Xience DES to distal CX. Mid OM3 appears to be total occluded, unable to wire through.   03/02/05 Echo ([facility]PR;  scanned under Media tab, Correspondence 05/31/13): Normal LVF, EF 50-55%. Mild LVH. Minimal MR.   If no changes, I anticipate pt can proceed with surgery as scheduled.   Willeen Cass, FNP-BC [facility] Short Stay Surgical Center/Anesthesiology Phone: [phone] 11/13/2015 4:11 PM

## 2015-11-16 NOTE — Anesthesia Preprocedure Evaluation (Addendum)
Anesthesia Evaluation  Patient identified by MRN, date of birth, ID band Patient awake    Reviewed: Allergy & Precautions, H&P , NPO status , Patient's Chart, lab work & pertinent test results  Airway Mallampati: II  TM Distance: >3 FB Neck ROM: Full    Dental  (+) Edentulous Upper, Partial Lower   Pulmonary former smoker,    breath sounds clear to auscultation       Cardiovascular hypertension, Pt. on medications and Pt. on home beta blockers + CAD  + dysrhythmias (rate controlled) Atrial Fibrillation  Rhythm:Regular Rate:Normal  05/20/09 Cardiac cath ([facility]PR; scanned under Media tab, Correspondence 05/31/13; done for abnormal stress test showing inferolateral ischemia): LM and LAD normal. 80% mid-distal CX. 30% proximal RCA. Summary: 1. Severe mid distal CX disease. 2. Mid OM3 total occluded. 3. LV gram not done. Intervention: Successful PCI/Xience DES to distal CX. Mid OM3 appears to be total occluded   Neuro/Psych    GI/Hepatic GERD  ,  Endo/Other  diabetes, Type 2  Renal/GU      Musculoskeletal  (+) Arthritis ,   Abdominal (+) + obese,   Peds  Hematology   Anesthesia Other Findings   Reproductive/Obstetrics                            Anesthesia Physical  Anesthesia Plan  ASA: III  Anesthesia Plan: General   Post-op Pain Management:    Induction: Intravenous  Airway Management Planned: Oral ETT  Additional Equipment:   Intra-op Plan:   Post-operative Plan:   Informed Consent: I have reviewed the patients History and Physical, chart, labs and discussed the procedure including the risks, benefits and alternatives for the proposed anesthesia with the patient or authorized representative who has indicated his/her understanding and acceptance.   Dental advisory given  Plan Discussed with: CRNA  Anesthesia Plan Comments: ( )        Anesthesia Quick Evaluation

## 2015-11-17 ENCOUNTER — Encounter (HOSPITAL_COMMUNITY)
Admission: RE | Disposition: A | Discharge status: Home / Self Care | Payer: Self-pay | Source: Ambulatory Visit | Attending: Neurosurgery

## 2015-11-17 ENCOUNTER — Encounter (HOSPITAL_COMMUNITY): Payer: Self-pay | Admitting: Certified Registered Nurse Anesthetist

## 2015-11-17 ENCOUNTER — Inpatient Hospital Stay (HOSPITAL_COMMUNITY): Payer: BLUE CROSS/BLUE SHIELD | Admitting: Anesthesiology

## 2015-11-17 ENCOUNTER — Inpatient Hospital Stay (HOSPITAL_COMMUNITY)
Admission: RE | Admit: 2015-11-17 | Discharge: 2015-11-19 | DRG: 472 | Disposition: A | Discharge status: Home / Self Care | Payer: BLUE CROSS/BLUE SHIELD | Source: Ambulatory Visit | Attending: Neurosurgery | Admitting: Neurosurgery

## 2015-11-17 ENCOUNTER — Inpatient Hospital Stay (HOSPITAL_COMMUNITY): Payer: BLUE CROSS/BLUE SHIELD

## 2015-11-17 ENCOUNTER — Inpatient Hospital Stay (HOSPITAL_COMMUNITY): Payer: BLUE CROSS/BLUE SHIELD | Admitting: Emergency Medicine

## 2015-11-17 DIAGNOSIS — M4722 Other spondylosis with radiculopathy, cervical region: Principal | ICD-10-CM | POA: Diagnosis present

## 2015-11-17 DIAGNOSIS — Z87891 Personal history of nicotine dependence: Secondary | ICD-10-CM | POA: Diagnosis not present

## 2015-11-17 DIAGNOSIS — I10 Essential (primary) hypertension: Secondary | ICD-10-CM | POA: Diagnosis present

## 2015-11-17 DIAGNOSIS — M960 Pseudarthrosis after fusion or arthrodesis: Secondary | ICD-10-CM | POA: Diagnosis present

## 2015-11-17 DIAGNOSIS — Z7902 Long term (current) use of antithrombotics/antiplatelets: Secondary | ICD-10-CM | POA: Diagnosis not present

## 2015-11-17 DIAGNOSIS — Z955 Presence of coronary angioplasty implant and graft: Secondary | ICD-10-CM

## 2015-11-17 DIAGNOSIS — Z7984 Long term (current) use of oral hypoglycemic drugs: Secondary | ICD-10-CM

## 2015-11-17 DIAGNOSIS — M542 Cervicalgia: Secondary | ICD-10-CM | POA: Diagnosis present

## 2015-11-17 DIAGNOSIS — M109 Gout, unspecified: Secondary | ICD-10-CM | POA: Diagnosis present

## 2015-11-17 DIAGNOSIS — M4802 Spinal stenosis, cervical region: Secondary | ICD-10-CM | POA: Diagnosis present

## 2015-11-17 DIAGNOSIS — K219 Gastro-esophageal reflux disease without esophagitis: Secondary | ICD-10-CM | POA: Diagnosis present

## 2015-11-17 DIAGNOSIS — Y832 Surgical operation with anastomosis, bypass or graft as the cause of abnormal reaction of the patient, or of later complication, without mention of misadventure at the time of the procedure: Secondary | ICD-10-CM | POA: Diagnosis present

## 2015-11-17 DIAGNOSIS — Z7982 Long term (current) use of aspirin: Secondary | ICD-10-CM | POA: Diagnosis not present

## 2015-11-17 DIAGNOSIS — I2510 Atherosclerotic heart disease of native coronary artery without angina pectoris: Secondary | ICD-10-CM | POA: Diagnosis present

## 2015-11-17 DIAGNOSIS — E669 Obesity, unspecified: Secondary | ICD-10-CM | POA: Diagnosis present

## 2015-11-17 DIAGNOSIS — S129XXA Fracture of neck, unspecified, initial encounter: Secondary | ICD-10-CM | POA: Diagnosis present

## 2015-11-17 DIAGNOSIS — E119 Type 2 diabetes mellitus without complications: Secondary | ICD-10-CM | POA: Diagnosis present

## 2015-11-17 DIAGNOSIS — Z85038 Personal history of other malignant neoplasm of large intestine: Secondary | ICD-10-CM

## 2015-11-17 DIAGNOSIS — Z6833 Body mass index (BMI) 33.0-33.9, adult: Secondary | ICD-10-CM | POA: Diagnosis not present

## 2015-11-17 DIAGNOSIS — Z79899 Other long term (current) drug therapy: Secondary | ICD-10-CM

## 2015-11-17 HISTORY — PX: POSTERIOR CERVICAL FUSION/FORAMINOTOMY: SHX5038

## 2015-11-17 LAB — GLUCOSE, CAPILLARY
GLUCOSE-CAPILLARY: 95 mg/dL (ref 65–99)
GLUCOSE-CAPILLARY: 83 mg/dL (ref 65–99)
GLUCOSE-CAPILLARY: 130 mg/dL — AB (ref 65–99)
GLUCOSE-CAPILLARY: 70 mg/dL (ref 65–99)
GLUCOSE-CAPILLARY: 116 mg/dL — AB (ref 65–99)
GLUCOSE-CAPILLARY: 130 mg/dL — AB (ref 65–99)
Glucose-Capillary: 76 mg/dL (ref 65–99)
Glucose-Capillary: 165 mg/dL — ABNORMAL HIGH (ref 65–99)

## 2015-11-17 SURGERY — POSTERIOR CERVICAL FUSION/FORAMINOTOMY LEVEL 2
Anesthesia: General | Site: Spine Cervical

## 2015-11-17 MED ORDER — PHENOL 1.4 % MT LIQD
1.0000 | OROMUCOSAL | Status: DC | PRN
  Filled 2015-11-17: qty 177

## 2015-11-17 MED ORDER — ONDANSETRON HCL 4 MG/2ML IJ SOLN
INTRAMUSCULAR | Status: DC | PRN
  Administered 2015-11-17: 4 mg via INTRAVENOUS

## 2015-11-17 MED ORDER — LACTATED RINGERS IV SOLN
INTRAVENOUS | Status: DC
  Administered 2015-11-17 (×2): via INTRAVENOUS

## 2015-11-17 MED ORDER — HYDRALAZINE HCL 20 MG/ML IJ SOLN
10.0000 mg | Freq: Once | INTRAMUSCULAR | Status: AC
  Administered 2015-11-17: 10 mg via INTRAVENOUS

## 2015-11-17 MED ORDER — HYDROCODONE-ACETAMINOPHEN 5-325 MG PO TABS: 1.0000 | ORAL_TABLET | ORAL | Status: DC | PRN

## 2015-11-17 MED ORDER — SERTRALINE HCL 50 MG PO TABS
25.0000 mg | ORAL_TABLET | Freq: Every day | ORAL | Status: DC
  Administered 2015-11-18 – 2015-11-19 (×2): 25 mg via ORAL
  Filled 2015-11-17 (×2): qty 1

## 2015-11-17 MED ORDER — HYDRALAZINE HCL 20 MG/ML IJ SOLN
INTRAMUSCULAR | Status: AC
  Filled 2015-11-17: qty 1

## 2015-11-17 MED ORDER — BUPIVACAINE HCL (PF) 0.25 % IJ SOLN
INTRAMUSCULAR | Status: DC | PRN
  Administered 2015-11-17: 20 mL

## 2015-11-17 MED ORDER — ACETAMINOPHEN 325 MG PO TABS: 650.0000 mg | ORAL_TABLET | ORAL | Status: DC | PRN

## 2015-11-17 MED ORDER — DEXTROSE 50 % IV SOLN
INTRAVENOUS | Status: AC
Start: 2015-11-17 — End: 2015-11-17
  Filled 2015-11-17: qty 50

## 2015-11-17 MED ORDER — LACTATED RINGERS IV SOLN
INTRAVENOUS | Status: DC | PRN
  Administered 2015-11-17 (×2): via INTRAVENOUS

## 2015-11-17 MED ORDER — POTASSIUM CHLORIDE CRYS ER 20 MEQ PO TBCR
20.0000 meq | EXTENDED_RELEASE_TABLET | Freq: Every day | ORAL | Status: DC
  Administered 2015-11-18 – 2015-11-19 (×2): 20 meq via ORAL
  Filled 2015-11-17 (×2): qty 1

## 2015-11-17 MED ORDER — ROCURONIUM BROMIDE 10 MG/ML (PF) SYRINGE
PREFILLED_SYRINGE | INTRAVENOUS | Status: AC
  Filled 2015-11-17: qty 10

## 2015-11-17 MED ORDER — BACITRACIN ZINC 500 UNIT/GM EX OINT
TOPICAL_OINTMENT | CUTANEOUS | Status: AC
  Filled 2015-11-17: qty 28.35

## 2015-11-17 MED ORDER — CHLORHEXIDINE GLUCONATE CLOTH 2 % EX PADS: 6.0000 | MEDICATED_PAD | Freq: Once | CUTANEOUS | Status: DC

## 2015-11-17 MED ORDER — THROMBIN 5000 UNITS EX SOLR
CUTANEOUS | Status: DC | PRN
  Administered 2015-11-17: 10000 [IU] via TOPICAL

## 2015-11-17 MED ORDER — METFORMIN HCL ER 500 MG PO TB24
1000.0000 mg | ORAL_TABLET | Freq: Two times a day (BID) | ORAL | Status: DC
  Administered 2015-11-18 – 2015-11-19 (×3): 1000 mg via ORAL
  Filled 2015-11-17 (×3): qty 2

## 2015-11-17 MED ORDER — TAMSULOSIN HCL 0.4 MG PO CAPS
0.4000 mg | ORAL_CAPSULE | Freq: Every day | ORAL | Status: DC
  Administered 2015-11-17 – 2015-11-18 (×2): 0.4 mg via ORAL
  Filled 2015-11-17 (×2): qty 1

## 2015-11-17 MED ORDER — PROPOFOL 10 MG/ML IV BOLUS
INTRAVENOUS | Status: DC | PRN
  Administered 2015-11-17: 30 mg via INTRAVENOUS
  Administered 2015-11-17: 170 mg via INTRAVENOUS
  Administered 2015-11-17: 30 mg via INTRAVENOUS

## 2015-11-17 MED ORDER — FENTANYL CITRATE (PF) 100 MCG/2ML IJ SOLN
INTRAMUSCULAR | Status: AC
  Filled 2015-11-17: qty 2

## 2015-11-17 MED ORDER — FENTANYL CITRATE (PF) 100 MCG/2ML IJ SOLN
INTRAMUSCULAR | Status: DC | PRN
Start: 2015-11-17 — End: 2015-11-17
  Administered 2015-11-17: 50 ug via INTRAVENOUS
  Administered 2015-11-17: 100 ug via INTRAVENOUS

## 2015-11-17 MED ORDER — DIAZEPAM 5 MG PO TABS
5.0000 mg | ORAL_TABLET | Freq: Four times a day (QID) | ORAL | Status: DC | PRN
  Administered 2015-11-17 (×2): 10 mg via ORAL
  Administered 2015-11-18 (×2): 5 mg via ORAL
  Filled 2015-11-17 (×2): qty 1
  Filled 2015-11-17 (×2): qty 2

## 2015-11-17 MED ORDER — ALLOPURINOL 100 MG PO TABS
100.0000 mg | ORAL_TABLET | Freq: Every day | ORAL | Status: DC
  Administered 2015-11-18 – 2015-11-19 (×2): 100 mg via ORAL
  Filled 2015-11-17 (×2): qty 1

## 2015-11-17 MED ORDER — LIDOCAINE 2% (20 MG/ML) 5 ML SYRINGE
INTRAMUSCULAR | Status: AC
  Filled 2015-11-17: qty 5

## 2015-11-17 MED ORDER — PROMETHAZINE HCL 25 MG/ML IJ SOLN: 6.2500 mg | INTRAMUSCULAR | Status: DC | PRN

## 2015-11-17 MED ORDER — MENTHOL 3 MG MT LOZG
1.0000 | LOZENGE | OROMUCOSAL | Status: DC | PRN
  Filled 2015-11-17: qty 9

## 2015-11-17 MED ORDER — OXYCODONE-ACETAMINOPHEN 5-325 MG PO TABS
1.0000 | ORAL_TABLET | ORAL | Status: DC | PRN
  Administered 2015-11-17 – 2015-11-19 (×10): 2 via ORAL
  Filled 2015-11-17 (×10): qty 2

## 2015-11-17 MED ORDER — ONDANSETRON HCL 4 MG/2ML IJ SOLN: 4.0000 mg | INTRAMUSCULAR | Status: DC | PRN

## 2015-11-17 MED ORDER — SUGAMMADEX SODIUM 200 MG/2ML IV SOLN
INTRAVENOUS | Status: AC
  Filled 2015-11-17: qty 2

## 2015-11-17 MED ORDER — LISINOPRIL 20 MG PO TABS
40.0000 mg | ORAL_TABLET | Freq: Every day | ORAL | Status: DC
  Administered 2015-11-17 – 2015-11-18 (×2): 40 mg via ORAL
  Filled 2015-11-17 (×2): qty 2

## 2015-11-17 MED ORDER — EPHEDRINE SULFATE 50 MG/ML IJ SOLN
INTRAMUSCULAR | Status: DC | PRN
  Administered 2015-11-17: 20 mg via INTRAVENOUS
  Administered 2015-11-17: 10 mg via INTRAVENOUS
  Administered 2015-11-17: 20 mg via INTRAVENOUS

## 2015-11-17 MED ORDER — THROMBIN 5000 UNITS EX SOLR
CUTANEOUS | Status: AC
  Filled 2015-11-17: qty 10000

## 2015-11-17 MED ORDER — LINAGLIPTIN 5 MG PO TABS
5.0000 mg | ORAL_TABLET | Freq: Every day | ORAL | Status: DC
  Administered 2015-11-18 – 2015-11-19 (×2): 5 mg via ORAL
  Filled 2015-11-17 (×2): qty 1

## 2015-11-17 MED ORDER — CHLORTHALIDONE 25 MG PO TABS
25.0000 mg | ORAL_TABLET | Freq: Every day | ORAL | Status: DC
  Administered 2015-11-18 – 2015-11-19 (×2): 25 mg via ORAL
  Filled 2015-11-17 (×3): qty 1

## 2015-11-17 MED ORDER — LACTATED RINGERS IV SOLN
INTRAVENOUS | Status: DC | PRN
  Administered 2015-11-17: 13:00:00 via INTRAVENOUS

## 2015-11-17 MED ORDER — QUINAPRIL HCL 10 MG PO TABS: 40.0000 mg | ORAL_TABLET | Freq: Every day | ORAL | Status: DC

## 2015-11-17 MED ORDER — HYDROMORPHONE HCL 1 MG/ML IJ SOLN: 0.5000 mg | INTRAMUSCULAR | Status: DC | PRN

## 2015-11-17 MED ORDER — PANTOPRAZOLE SODIUM 40 MG PO TBEC
40.0000 mg | DELAYED_RELEASE_TABLET | Freq: Every evening | ORAL | Status: DC
  Administered 2015-11-17 – 2015-11-18 (×2): 40 mg via ORAL
  Filled 2015-11-17 (×2): qty 1

## 2015-11-17 MED ORDER — METOPROLOL SUCCINATE ER 25 MG PO TB24
50.0000 mg | ORAL_TABLET | Freq: Every day | ORAL | Status: DC
  Administered 2015-11-18 – 2015-11-19 (×2): 50 mg via ORAL
  Filled 2015-11-17 (×2): qty 2

## 2015-11-17 MED ORDER — DEXTROSE 5 % IV SOLN
INTRAVENOUS | Status: DC | PRN
  Administered 2015-11-17: 30 ug/min via INTRAVENOUS

## 2015-11-17 MED ORDER — EPHEDRINE 5 MG/ML INJ
INTRAVENOUS | Status: AC
  Filled 2015-11-17: qty 10

## 2015-11-17 MED ORDER — INSULIN ASPART 100 UNIT/ML ~~LOC~~ SOLN
0.0000 [IU] | Freq: Three times a day (TID) | SUBCUTANEOUS | Status: DC
  Administered 2015-11-18: 4 [IU] via SUBCUTANEOUS
  Administered 2015-11-18: 3 [IU] via SUBCUTANEOUS

## 2015-11-17 MED ORDER — CEFAZOLIN IN D5W 1 GM/50ML IV SOLN
1.0000 g | Freq: Three times a day (TID) | INTRAVENOUS | Status: AC
  Administered 2015-11-17 – 2015-11-18 (×2): 1 g via INTRAVENOUS
  Filled 2015-11-17 (×2): qty 50

## 2015-11-17 MED ORDER — DIAZEPAM 5 MG PO TABS
ORAL_TABLET | ORAL | Status: AC
  Filled 2015-11-17: qty 2

## 2015-11-17 MED ORDER — ROCURONIUM BROMIDE 10 MG/ML (PF) SYRINGE
PREFILLED_SYRINGE | INTRAVENOUS | Status: DC | PRN
  Administered 2015-11-17: 50 mg via INTRAVENOUS
  Administered 2015-11-17: 20 mg via INTRAVENOUS

## 2015-11-17 MED ORDER — OXYCODONE HCL 5 MG PO TABS
ORAL_TABLET | ORAL | Status: AC
  Filled 2015-11-17: qty 1

## 2015-11-17 MED ORDER — SODIUM CHLORIDE 0.9% FLUSH
3.0000 mL | Freq: Two times a day (BID) | INTRAVENOUS | Status: DC
  Administered 2015-11-17: 3 mL via INTRAVENOUS

## 2015-11-17 MED ORDER — OXYCODONE HCL 5 MG/5ML PO SOLN: 5.0000 mg | Freq: Once | ORAL | Status: AC | PRN

## 2015-11-17 MED ORDER — BACITRACIN ZINC 500 UNIT/GM EX OINT
TOPICAL_OINTMENT | CUTANEOUS | Status: DC | PRN
  Administered 2015-11-17: 1 via TOPICAL

## 2015-11-17 MED ORDER — PROPOFOL 10 MG/ML IV BOLUS
INTRAVENOUS | Status: AC
  Filled 2015-11-17: qty 40

## 2015-11-17 MED ORDER — DEXTROSE 50 % IV SOLN
25.0000 mL | Freq: Once | INTRAVENOUS | Status: AC
  Administered 2015-11-17: 25 mL via INTRAVENOUS
  Filled 2015-11-17: qty 50

## 2015-11-17 MED ORDER — ONDANSETRON HCL 4 MG/2ML IJ SOLN
INTRAMUSCULAR | Status: AC
  Filled 2015-11-17: qty 2

## 2015-11-17 MED ORDER — MIDAZOLAM HCL 2 MG/2ML IJ SOLN
INTRAMUSCULAR | Status: AC
  Filled 2015-11-17: qty 2

## 2015-11-17 MED ORDER — OXYCODONE HCL 5 MG PO TABS
5.0000 mg | ORAL_TABLET | Freq: Once | ORAL | Status: AC | PRN
  Administered 2015-11-17: 5 mg via ORAL

## 2015-11-17 MED ORDER — FENTANYL CITRATE (PF) 100 MCG/2ML IJ SOLN
25.0000 ug | INTRAMUSCULAR | Status: DC | PRN
  Administered 2015-11-17 (×3): 50 ug via INTRAVENOUS

## 2015-11-17 MED ORDER — MIDAZOLAM HCL 5 MG/5ML IJ SOLN
INTRAMUSCULAR | Status: DC | PRN
  Administered 2015-11-17: 2 mg via INTRAVENOUS

## 2015-11-17 MED ORDER — BUPIVACAINE HCL (PF) 0.25 % IJ SOLN
INTRAMUSCULAR | Status: AC
  Filled 2015-11-17: qty 30

## 2015-11-17 MED ORDER — ASPIRIN 325 MG PO TABS
325.0000 mg | ORAL_TABLET | Freq: Every day | ORAL | Status: DC
  Administered 2015-11-18 – 2015-11-19 (×2): 325 mg via ORAL
  Filled 2015-11-17 (×2): qty 1

## 2015-11-17 MED ORDER — 0.9 % SODIUM CHLORIDE (POUR BTL) OPTIME
TOPICAL | Status: DC | PRN
  Administered 2015-11-17: 1000 mL

## 2015-11-17 MED ORDER — SODIUM CHLORIDE 0.9% FLUSH: 3.0000 mL | INTRAVENOUS | Status: DC | PRN

## 2015-11-17 MED ORDER — ACETAMINOPHEN 650 MG RE SUPP: 650.0000 mg | RECTAL | Status: DC | PRN

## 2015-11-17 MED ORDER — HEMOSTATIC AGENTS (NO CHARGE) OPTIME
TOPICAL | Status: DC | PRN
  Administered 2015-11-17: 1 via TOPICAL

## 2015-11-17 MED ORDER — ATORVASTATIN CALCIUM 20 MG PO TABS
40.0000 mg | ORAL_TABLET | Freq: Every evening | ORAL | Status: DC
  Administered 2015-11-17 – 2015-11-18 (×2): 40 mg via ORAL
  Filled 2015-11-17 (×2): qty 2

## 2015-11-17 MED ORDER — DEXAMETHASONE SODIUM PHOSPHATE 10 MG/ML IJ SOLN
10.0000 mg | INTRAMUSCULAR | Status: AC
  Administered 2015-11-17: 10 mg via INTRAVENOUS
  Filled 2015-11-17: qty 1

## 2015-11-17 MED ORDER — SODIUM CHLORIDE 0.9 % IR SOLN
Status: DC | PRN
  Administered 2015-11-17: 14:00:00

## 2015-11-17 MED ORDER — FENTANYL CITRATE (PF) 100 MCG/2ML IJ SOLN
INTRAMUSCULAR | Status: AC
  Filled 2015-11-17: qty 4

## 2015-11-17 MED ORDER — CEFAZOLIN SODIUM-DEXTROSE 2-4 GM/100ML-% IV SOLN
2.0000 g | INTRAVENOUS | Status: AC
  Administered 2015-11-17: 2 g via INTRAVENOUS
  Filled 2015-11-17: qty 100

## 2015-11-17 MED ORDER — SUGAMMADEX SODIUM 200 MG/2ML IV SOLN
INTRAVENOUS | Status: DC | PRN
  Administered 2015-11-17: 200 mg via INTRAVENOUS

## 2015-11-17 MED ORDER — LIDOCAINE 2% (20 MG/ML) 5 ML SYRINGE
INTRAMUSCULAR | Status: DC | PRN
  Administered 2015-11-17: 100 mg via INTRAVENOUS

## 2015-11-17 MED ORDER — NITROGLYCERIN 0.4 MG SL SUBL: 0.4000 mg | SUBLINGUAL_TABLET | SUBLINGUAL | Status: DC | PRN

## 2015-11-17 SURGICAL SUPPLY — 57 items
BAG DECANTER FOR FLEXI CONT (MISCELLANEOUS) ×3 IMPLANT
BENZOIN TINCTURE PRP APPL 2/3 (GAUZE/BANDAGES/DRESSINGS) ×3 IMPLANT
BIT DRILL 2.4X (BIT) ×1 IMPLANT
BIT DRL 2.4X (BIT) ×1
BLADE CLIPPER SURG (BLADE) IMPLANT
BUR MATCHSTICK NEURO 3.0 LAGG (BURR) ×3 IMPLANT
CANISTER SUCT 3000ML PPV (MISCELLANEOUS) ×3 IMPLANT
CLOSURE STERI-STRIP 1/2X4 (GAUZE/BANDAGES/DRESSINGS) ×1
CLOSURE WOUND 1/2 X4 (GAUZE/BANDAGES/DRESSINGS) ×1
CLSR STERI-STRIP ANTIMIC 1/2X4 (GAUZE/BANDAGES/DRESSINGS) ×2 IMPLANT
DERMABOND ADVANCED (GAUZE/BANDAGES/DRESSINGS) ×2
DERMABOND ADVANCED .7 DNX12 (GAUZE/BANDAGES/DRESSINGS) ×1 IMPLANT
DRAPE C-ARM 42X72 X-RAY (DRAPES) ×6 IMPLANT
DRAPE LAPAROTOMY 100X72 PEDS (DRAPES) ×3 IMPLANT
DRAPE POUCH INSTRU U-SHP 10X18 (DRAPES) ×3 IMPLANT
DRILL BIT (BIT) ×2
DRSG OPSITE POSTOP 4X6 (GAUZE/BANDAGES/DRESSINGS) ×3 IMPLANT
DURAPREP 26ML APPLICATOR (WOUND CARE) ×3 IMPLANT
ELECT REM PT RETURN 9FT ADLT (ELECTROSURGICAL) ×3
ELECTRODE REM PT RTRN 9FT ADLT (ELECTROSURGICAL) ×1 IMPLANT
EVACUATOR 1/8 PVC DRAIN (DRAIN) IMPLANT
GAUZE SPONGE 4X4 12PLY STRL (GAUZE/BANDAGES/DRESSINGS) ×3 IMPLANT
GAUZE SPONGE 4X4 16PLY XRAY LF (GAUZE/BANDAGES/DRESSINGS) IMPLANT
GLOVE ECLIPSE 9.0 STRL (GLOVE) ×3 IMPLANT
GLOVE EXAM NITRILE LRG STRL (GLOVE) IMPLANT
GLOVE EXAM NITRILE XL STR (GLOVE) IMPLANT
GLOVE EXAM NITRILE XS STR PU (GLOVE) IMPLANT
GOWN STRL REUS W/ TWL LRG LVL3 (GOWN DISPOSABLE) IMPLANT
GOWN STRL REUS W/ TWL XL LVL3 (GOWN DISPOSABLE) ×1 IMPLANT
GOWN STRL REUS W/TWL 2XL LVL3 (GOWN DISPOSABLE) IMPLANT
GOWN STRL REUS W/TWL LRG LVL3 (GOWN DISPOSABLE)
GOWN STRL REUS W/TWL XL LVL3 (GOWN DISPOSABLE) ×2
GRAFT BN 5X1XSPNE CVD POST DBM (Bone Implant) ×1 IMPLANT
GRAFT BONE MAGNIFUSE 1X5CM (Bone Implant) ×2 IMPLANT
KIT BASIN OR (CUSTOM PROCEDURE TRAY) ×3 IMPLANT
KIT ROOM TURNOVER OR (KITS) ×3 IMPLANT
NEEDLE HYPO 22GX1.5 SAFETY (NEEDLE) ×3 IMPLANT
NEEDLE SPNL 22GX3.5 QUINCKE BK (NEEDLE) ×3 IMPLANT
NS IRRIG 1000ML POUR BTL (IV SOLUTION) ×3 IMPLANT
PACK LAMINECTOMY NEURO (CUSTOM PROCEDURE TRAY) ×3 IMPLANT
PAD ARMBOARD 7.5X6 YLW CONV (MISCELLANEOUS) ×9 IMPLANT
PIN MAYFIELD SKULL DISP (PIN) ×3 IMPLANT
ROD PRECUT BULLET 3.5X25 (Rod) ×6 IMPLANT
SCREW CANCELLOUS 3.5X14MM (Screw) ×12 IMPLANT
SCREW SET M6 (Screw) ×12 IMPLANT
SPONGE LAP 4X18 X RAY DECT (DISPOSABLE) IMPLANT
SPONGE SURGIFOAM ABS GEL SZ50 (HEMOSTASIS) ×3 IMPLANT
STRIP CLOSURE SKIN 1/2X4 (GAUZE/BANDAGES/DRESSINGS) ×2 IMPLANT
SUT VIC AB 0 CT1 18XCR BRD8 (SUTURE) ×2 IMPLANT
SUT VIC AB 0 CT1 8-18 (SUTURE) ×4
SUT VIC AB 2-0 CT1 18 (SUTURE) ×3 IMPLANT
SUT VIC AB 3-0 SH 8-18 (SUTURE) ×3 IMPLANT
TOWEL OR 17X24 6PK STRL BLUE (TOWEL DISPOSABLE) ×3 IMPLANT
TOWEL OR 17X26 10 PK STRL BLUE (TOWEL DISPOSABLE) ×3 IMPLANT
TUBE CONNECTING 12'X1/4 (SUCTIONS) ×1
TUBE CONNECTING 12X1/4 (SUCTIONS) ×2 IMPLANT
WATER STERILE IRR 1000ML POUR (IV SOLUTION) ×3 IMPLANT

## 2015-11-17 NOTE — Anesthesia Procedure Notes (Signed)
Procedure Name: Intubation Date/Time: 11/17/2015 12:43 PM Performed by: Garrison Columbus T Pre-anesthesia Checklist: Patient identified, Emergency Drugs available, Suction available and Patient being monitored Patient Re-evaluated:Patient Re-evaluated prior to inductionOxygen Delivery Method: Circle System Utilized Preoxygenation: Pre-oxygenation with 100% oxygen Intubation Type: IV induction Ventilation: Mask ventilation without difficulty and Oral airway inserted - appropriate to patient size Laryngoscope Size: Glidescope and 4 Grade View: Grade I Tube type: Oral Tube size: 7.5 mm Number of attempts: 1 Airway Equipment and Method: Stylet,  Oral airway and Video-laryngoscopy Placement Confirmation: ETT inserted through vocal cords under direct vision,  positive ETCO2 and breath sounds checked- equal and bilateral Secured at: 23 cm Tube secured with: Tape Dental Injury: Teeth and Oropharynx as per pre-operative assessment

## 2015-11-17 NOTE — Progress Notes (Addendum)
CBG: 76 @ 1040. Pt. Stating he's "  Feeling jittery". Notified Dr. Lamarr Lulas, new orders received. Gave 1/2 amp Dextrose IV.  CBG 130@ 1045. Pt. States he feels fine, no jittery.  Wife left hospital and forgot to take pt's glasses.  Glasses sent to OR with CRNA.

## 2015-11-17 NOTE — H&P (Signed)
Bruce Dyer is an 65 y.o. male.   Chief Complaint: Neck and right arm pain HPI: 64 year old male who is remotely status post C3-4 and C4-5 anterior cervical discectomy and fusion with good results. Patient subtotally underwent C5-6 and C6-7 anterior cervical decompression and fusion. Patient's postoperative course has been complicated with pseudoarthrosis the Foley C5-6 and possibly at C6-7. Patient with progressive spondylosis on the right at C5-6 with foraminal stenosis and radiculopathy. Patient has significant neck pain and intermittent right upper extremity pain with associated numbness consistent with a C6 radiculopathy. He has no left-sided symptoms. He has no lower extremity dysfunction. Patient presents now for posterior cervical decompression and fusion.  Past Medical History:  Diagnosis Date  . Arthritis   . Cancer ([place])    HX COLON CA 2013  . Complication of anesthesia    acute urinary retention  . Coronary artery disease   . Cough   . Diabetes mellitus without complication ([place])    type 2    dx 'yrs ago'  . Dysrhythmia    IRREG HEARTBEAT DR. CHIU   . GERD (gastroesophageal reflux disease)   . Gout   . Hypertension     Past Surgical History:  Procedure Laterality Date  . ANTERIOR CERVICAL DECOMP/DISCECTOMY FUSION N/A 05/31/2013   Procedure: ANTERIOR CERVICAL DECOMPRESSION/DISCECTOMY FUSION CERVICAL FIVE-SIX, SIX-SEVEN;  Surgeon: Charlie Pitter, MD;  Location: [place] NEURO ORS;  Service: Neurosurgery;  Laterality: N/A;  ANTERIOR CERVICAL DECOMPRESSION/DISCECTOMY FUSION CERVICAL FIVE-SIX, SIX-SEVEN  . CARDIAC CATHETERIZATION    . CARDIOVASCULAR STRESS TEST  05/30/13   fixed defect involving the inferolateral wall along the mid and basilar segments; an area of infarct cannot be excluded; no evidence of ischemia, EF 46% ([facility]PR)  . CERVICAL SPINE SURGERY    . COLON SURGERY    . COLONOSCOPY    . CORONARY ANGIOPLASTY     STENT    2009 or 2010  . ROTATOR CUFF REPAIR     BILATERAL     History reviewed. No pertinent family history. Social History:  reports that he quit smoking about 28 years ago. He has never used smokeless tobacco. He reports that he does not drink alcohol or use drugs.  Allergies:  Allergies  Allergen Reactions  . No Known Allergies     Medications Prior to Admission  Medication Sig Dispense Refill  . allopurinol (ZYLOPRIM) 100 MG tablet Take 100 mg by mouth daily.  5  . aspirin 325 MG tablet Take 325 mg by mouth daily.    Marland Kitchen atorvastatin (LIPITOR) 40 MG tablet Take 40 mg by mouth every evening.    . chlorthalidone (HYGROTON) 25 MG tablet Take 25 mg by mouth daily.  6  . clopidogrel (PLAVIX) 75 MG tablet Take 75 mg by mouth daily with breakfast.    . ibuprofen (ADVIL,MOTRIN) 800 MG tablet Take 800 mg by mouth every 6 (six) hours as needed. For pain.  1  . KLOR-CON M20 20 MEQ tablet Take 20 mEq by mouth daily.  2  . metFORMIN (GLUCOPHAGE-XR) 500 MG 24 hr tablet Take 1,000 mg by mouth 2 (two) times daily.  5  . metoprolol succinate (TOPROL-XL) 50 MG 24 hr tablet Take 50 mg by mouth daily. Take with or immediately following a meal.    . pantoprazole (PROTONIX) 40 MG tablet Take 40 mg by mouth every evening.  1  . quinapril (ACCUPRIL) 40 MG tablet Take 40 mg by mouth at bedtime.    . sertraline (ZOLOFT) 25 MG  tablet Take 25 mg by mouth daily.  11  . sitaGLIPtin (JANUVIA) 100 MG tablet Take 100 mg by mouth daily.    . tamsulosin (FLOMAX) 0.4 MG CAPS capsule Take 0.4 mg by mouth daily after supper.    . nitroGLYCERIN (NITROSTAT) 0.4 MG SL tablet Place 0.4 mg under the tongue every 5 (five) minutes as needed for chest pain.       Results for orders placed or performed during the hospital encounter of 11/17/15 (from the past 48 hour(s))  Glucose, capillary     Status: None   Collection Time: 11/17/15  6:40 AM  Result Value Ref Range   Glucose-Capillary 95 65 - 99 mg/dL   Comment 1 Notify RN    Comment 2 Document in Chart    No results  found.  Pertinent items noted in HPI and remainder of comprehensive ROS otherwise negative.  Blood pressure (!) 166/67, pulse (!) 52, temperature 98.1 F (36.7 C), temperature source Oral, resp. rate 20, weight 103.9 kg (229 lb), SpO2 100 %.  Patient is awake and alert. He is oriented and appropriate. His speech is fluent. His judgment and insight are intact. His cranial nerve function is normal bilaterally. Motor examination reveals mild weakness of his right wrist extensors otherwise motor strength intact. Sensory examination was decreased sensation to light touch in his right C6 dermatome. Deep tendon reflexes are normal active. Gait and posture normal. Examination head ears eyes and thirds unremarkable. Chest and abdomen are benign. Extremities are free from injury deformity. 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 C5-6 and possible C6-7 pseudoarthrosis with persistent neck pain. Plan right C5-6 decompressive laminotomy and foraminotomies with C5-6 and 7 posterior cervical fusion with lateral mass instrumentation. Risks and benefits of been explained. Patient wishes to proceed.  Christophe Rising A 11/17/2015, 7:50 AM

## 2015-11-17 NOTE — Anesthesia Postprocedure Evaluation (Signed)
Anesthesia Post Note  Patient: Bruce Dyer  Procedure(s) Performed: Procedure(s) (LRB): Posterior Cervical Fusion with lateral mass fixation - Cervical Five - Cervical Six, right - Cervical Five - Cervical Six Foraminotomy (N/A)  Patient location during evaluation: PACU Anesthesia Type: General Level of consciousness: awake and alert Pain management: pain level controlled Vital Signs Assessment: post-procedure vital signs reviewed and stable Respiratory status: spontaneous breathing, nonlabored ventilation, respiratory function stable and patient connected to nasal cannula oxygen Cardiovascular status: blood pressure returned to baseline and stable Postop Assessment: no signs of nausea or vomiting Anesthetic complications: no    Last Vitals:  Vitals:   11/17/15 1615 11/17/15 1630  BP: (!) 185/83 (!) 176/77  Pulse: (!) 57 (!) 57  Resp: 17 14  Temp:      Last Pain:  Vitals:   11/17/15 1615  TempSrc:   PainSc: Asleep                 Reginal Lutes

## 2015-11-17 NOTE — Op Note (Signed)
Date of procedure: 11/17/2015  Date of dictation: Same  Service: Neurosurgery  Preoperative diagnosis: C5-6 pseudoarthrosis, possible C6-7 pseudoarthrosis  Right C5-6 spondylosis with radiculopathy  Postoperative diagnosis: Same, C6-7 with solid arthrodesis  Procedure Name: Right C5-6 decompressive laminotomy and foraminotomy with microdissection  Reexploration of C5-C6 and C7 fusion.  C5-6 posterior lateral arthrodesis utilizing nonsegmental lateral mass screw instrumentation, local autograft, and morselized allograft.  Surgeon:Marylouise Mallet A.Ardine Iacovelli, M.D.  Asst. Surgeon: None  Anesthesia: General  Indication: 65 year old male status post remote C3-4-5 anterior cervical discectomy and fusion. Patient with more recent C5-C7 anterior cervical discectomy with fusion. Patient with persistent neck pain and right upper extremity symptoms failing conservative management. Workup demonstrates evidence of pseudarthrosis at C5-6 and possible pseudarthrosis at C6-7. Patient with persistent right C6 radicular symptoms and has significant foraminal stenosis on the right at C5-6. Plan for reexploration of his posterior cervical spine with right C5-6 foraminotomies and C5-6 and possible C6-7 posterior fusion.  Operative note: After induction of anesthesia, patient position prone onto bolsters with his head fixed in a [place] pin headrest. Patient's posterior cervical regions prepped and draped sterilely. Incision made overlying C5-C7. Subperiosteal dissection performed bilaterally. Retractor placed. Fluoroscopy used. Levels confirmed. The joint spaces at C5-6 and C6-7 were explored. There is obvious motion at C5-6 but C6-7 with solidly fused. With this in mind I chose not to add an additional posterior fusion at C6-7. Laminotomies then performed on the right side at C5-6 using high-speed drill and Kerrison rongeurs to remove the inferior aspect lamina of C5. The superior aspect lamina of C6. And the medial aspect  of the C5-6 facet joint. Ligament flavum elevated and resected. Underlying thecal sac and C6 nerve root was identified. Decompressive foraminotomies carried out along the course of the right C6 nerve root. This point a very thorough decompression been achieved. There is no evidence of injury to the thecal sac or nerve roots. Entry sites for lateral mass instrumentation were then obtained upon the facet complexes of C5 and C6. Pilot holes were drilled. Pilot holes were probed and found to be solidly within bone. 14 mm Medtronic Vertex screws were placed bilaterally at C5 and C6 in a trajectory approximately 20 a lateral and 20 cephalad. Short segment titanium rods placed over the screws. Locking caps placed over the screws. Locking caps engaged. Lateral facets were decorticated. Bone was harvested. This bone coupled with bone to the laminotomies were used and placed down for later fusion. Magna fuse packets were placed laterally. Wound is irrigated MI solution. Wounds and closed in layers with Vicryl sutures. Steri-Strips and sterile dressing were applied. No apparent complications. Patient tolerated procedure well and he returns to the recovery room postop.

## 2015-11-17 NOTE — Transfer of Care (Signed)
Immediate Anesthesia Transfer of Care Note  Patient: Bruce Dyer  Procedure(s) Performed: Procedure(s) with comments: Posterior Cervical Fusion with lateral mass fixation - Cervical Five - Cervical Six, right - Cervical Five - Cervical Six Foraminotomy (N/A) - Posterior Cervical Fusion with lateral mass fixation - Cervical Five - Cervical Six, right - Cervical Five - Cervical Six Foraminotomy  Patient Location: PACU  Anesthesia Type:General  Level of Consciousness: awake and alert   Airway & Oxygen Therapy: Patient Spontanous Breathing  Post-op Assessment: Report given to RN, Post -op Vital signs reviewed and stable and Patient moving all extremities X 4  Post vital signs: Reviewed and stable  Last Vitals:  Vitals:   11/17/15 0636 11/17/15 1445  BP: (!) 166/67 (!) 172/79  Pulse:  (!) 59  Resp:  12  Temp:  36.4 C    Last Pain:  Vitals:   11/17/15 0635  TempSrc: Oral      Patients Stated Pain Goal: 3 ([id number])  Complications: No apparent anesthesia complications

## 2015-11-17 NOTE — Brief Op Note (Signed)
11/17/2015  2:38 PM  PATIENT:  Kris Mouton E Reesor  65 y.o. male  PRE-OPERATIVE DIAGNOSIS:  Cervical pseudoarthrosis  POST-OPERATIVE DIAGNOSIS:  Cervical pseudoarthrosis  PROCEDURE:  Procedure(s) with comments: Posterior Cervical Fusion with lateral mass fixation - Cervical Five - Cervical Six, right - Cervical Five - Cervical Six Foraminotomy (N/A) - Posterior Cervical Fusion with lateral mass fixation - Cervical Five - Cervical Six, right - Cervical Five - Cervical Six Foraminotomy  SURGEON:  Surgeon(s) and Role:    * Earnie Larsson, MD - Primary  PHYSICIAN ASSISTANT:   ASSISTANTS:    ANESTHESIA:   general  EBL:  Total I/O In: 1600 [I.V.:1600] Out: 200 [Blood:200]  BLOOD ADMINISTERED:none  DRAINS: none   LOCAL MEDICATIONS USED:  MARCAINE     SPECIMEN:  No Specimen  DISPOSITION OF SPECIMEN:  N/A  COUNTS:  YES  TOURNIQUET:  * No tourniquets in log *  DICTATION: .Dragon Dictation  PLAN OF CARE: Admit to inpatient   PATIENT DISPOSITION:  PACU - hemodynamically stable.   Delay start of Pharmacological VTE agent (>24hrs) due to surgical blood loss or risk of bleeding: yes

## 2015-11-17 NOTE — Progress Notes (Signed)
Orthopedic Tech Progress Note Patient Details:  Bruce Dyer 1950/07/17 [id number]  Ortho Devices Type of Ortho Device: Soft collar Ortho Device/Splint Interventions: Application   [name] 11/17/2015, 3:26 PM

## 2015-11-18 ENCOUNTER — Encounter (HOSPITAL_COMMUNITY): Payer: Self-pay | Admitting: Neurosurgery

## 2015-11-18 LAB — GLUCOSE, CAPILLARY
GLUCOSE-CAPILLARY: 157 mg/dL — AB (ref 65–99)
GLUCOSE-CAPILLARY: 149 mg/dL — AB (ref 65–99)
Glucose-Capillary: 112 mg/dL — ABNORMAL HIGH (ref 65–99)

## 2015-11-18 NOTE — Evaluation (Signed)
Occupational Therapy Evaluation Patient Details Name: Bruce Dyer MRN: [id number] DOB: 08/08/1950 Today's Date: 11/18/2015    History of Present Illness 65 yo male sp C5-6-7 fusion PMH:  Past Medical History:  Diagnosis Date  . Arthritis   . Cancer ([place])    HX COLON CA 2013  . Complication of anesthesia    acute urinary retention  . Coronary artery disease   . Cough   . Diabetes mellitus without complication ([place])    type 2    dx 'yrs ago'  . Dysrhythmia    IRREG HEARTBEAT DR. CHIU   . GERD (gastroesophageal reflux disease)   . Gout   . Hypertension       Clinical Impression   Patient evaluated by Occupational Therapy with no further acute OT needs identified. All education has been completed and the patient has no further questions. See below for any follow-up Occupational Therapy or equipment needs. OT to sign off. Thank you for referral.      Follow Up Recommendations  No OT follow up    Equipment Recommendations  None recommended by OT    Recommendations for Other Services       Precautions / Restrictions Precautions Precautions: Cervical Precaution Comments: handout provided and reviewed in detail Required Braces or Orthoses: Cervical Brace Cervical Brace: Soft collar      Mobility Bed Mobility               General bed mobility comments: in chair on arrival  Transfers Overall transfer level: Modified independent                    Balance                                            ADL Overall ADL's : Modified independent                                             Vision     Perception     Praxis      Pertinent Vitals/Pain Pain Assessment: 0-10 Pain Score: 5  Pain Descriptors / Indicators: Sore Pain Intervention(s): Repositioned;Premedicated before session;Monitored during session     Hand Dominance Right   Extremity/Trunk Assessment Upper Extremity Assessment Upper  Extremity Assessment: Overall WFL for tasks assessed   Lower Extremity Assessment Lower Extremity Assessment: Defer to PT evaluation   Cervical / Trunk Assessment Cervical / Trunk Assessment: Other exceptions (s/p surg)   Communication Communication Communication: No difficulties (does report sore throat)   Cognition Arousal/Alertness: Awake/alert Behavior During Therapy: WFL for tasks assessed/performed Overall Cognitive Status: Within Functional Limits for tasks assessed                     General Comments       Exercises       Shoulder Instructions      Home Living Family/patient expects to be discharged to:: Private residence Living Arrangements: Spouse/significant other;Children Available Help at Discharge: Family Type of Home: House             Bathroom Shower/Tub: Walk-in Psychologist, prison and probation services: Standard                Prior Functioning/Environment  Level of Independence: Independent                 OT Problem List:     OT Treatment/Interventions:      OT Goals(Current goals can be found in the care plan section) Acute Rehab OT Goals Potential to Achieve Goals: Good  OT Frequency:     Barriers to D/C:            Co-evaluation              End of Session Equipment Utilized During Treatment: Cervical collar Nurse Communication: Mobility status;Precautions  Activity Tolerance: Patient tolerated treatment well Patient left: in chair;with call bell/phone within reach   Time: 0841-0850 OT Time Calculation (min): 9 min Charges:  OT General Charges $OT Visit: 1 Procedure OT Evaluation $OT Eval Low Complexity: 1 Procedure G-Codes:    Parke Poisson B Nov 26, 2015, 9:15 AM   Jeri Modena   OTR/L PagerOH:[phone] Office: [phone] .

## 2015-11-18 NOTE — Progress Notes (Signed)
Postop day 1. Patient doing reasonably well. Pain well controlled. Right upper extremity feels better. Patient feels that he needs a [place] bit more time with mobility training before discharge home. On examination awake and alert. Oriented and appropriate. Cranial nerve function intact. Motor and sensory function of his extremities intact. Wound clean and dry. Chest and abdomen benign.  Status post posterior cervical decompression and fusion. Patient overall doing well. Mobilize and probable discharge in morning.

## 2015-11-18 NOTE — Evaluation (Signed)
Physical Therapy Evaluation Patient Details Name: Bruce Dyer MRN: [id number] DOB: 1950/04/16 Today's Date: 11/18/2015   History of Present Illness  65 yo male sp C5-6-7 fusion  Clinical Impression  Pt admitted with above diagnosis. Pt currently with functional limitations due to the deficits listed below (see PT Problem List). At the time of PT eval pt was able to perform transfers and ambulation with supervision to min guard assist for balance. Pt will benefit from skilled PT to increase their independence and safety with mobility to allow discharge to the venue listed below.       Follow Up Recommendations No PT follow up;Supervision for mobility/OOB    Equipment Recommendations  None recommended by PT    Recommendations for Other Services       Precautions / Restrictions Precautions Precautions: Cervical;Fall Precaution Comments: handout provided and reviewed in detail Required Braces or Orthoses: Cervical Brace Cervical Brace: Soft collar Restrictions Weight Bearing Restrictions: No      Mobility  Bed Mobility               General bed mobility comments: in chair on arrival  Transfers Overall transfer level: Modified independent Equipment used: None             General transfer comment: Pt was able to power-up to full stand with no physical assistance. Pt was cued for minimal weight through UE's for safe rise to stand.   Ambulation/Gait Ambulation/Gait assistance: Min guard Ambulation Distance (Feet): 200 Feet Assistive device: None Gait Pattern/deviations: Step-through pattern;Decreased stride length;Trunk flexed Gait velocity: Decreased Gait velocity interpretation: Below normal speed for age/gender General Gait Details: VC's for improved posture. Pt generally unsteady and demonstrated ~3 lateral losses of balance. No assist required to recover, however pt held to wall for support. Declined use of RW or cane for support.   Stairs Stairs:  Yes Stairs assistance: Min guard Stair Management: One rail Left;Alternating pattern;Forwards Number of Stairs: 10 General stair comments: VC's for genreal safety and sequencing.   Wheelchair Mobility    Modified Rankin (Stroke Patients Only)       Balance Overall balance assessment: Needs assistance Sitting-balance support: Feet supported;No upper extremity supported Sitting balance-Leahy Scale: Good     Standing balance support: No upper extremity supported;During functional activity Standing balance-Leahy Scale: Fair Standing balance comment: Occasional LOB with no assist required to recover                             Pertinent Vitals/Pain Pain Assessment: Faces Pain Score: 5  Faces Pain Scale: Hurts even more Pain Location: Incision site Pain Descriptors / Indicators: Operative site guarding;Aching Pain Intervention(s): Limited activity within patient's tolerance;Monitored during session;Repositioned    Home Living Family/patient expects to be discharged to:: Private residence Living Arrangements: Spouse/significant other;Children Available Help at Discharge: Family Type of Home: House Home Access: Level entry     Home Layout: Two level        Prior Function Level of Independence: Independent               Hand Dominance   Dominant Hand: Right    Extremity/Trunk Assessment   Upper Extremity Assessment: Defer to OT evaluation           Lower Extremity Assessment: Generalized weakness      Cervical / Trunk Assessment: Other exceptions (s/p surg)  Communication   Communication: No difficulties (does report sore throat)  Cognition Arousal/Alertness: Awake/alert Behavior  During Therapy: WFL for tasks assessed/performed Overall Cognitive Status: Within Functional Limits for tasks assessed                      General Comments      Exercises     Assessment/Plan    PT Assessment Patient needs continued PT services   PT Problem List Decreased strength;Decreased range of motion;Decreased activity tolerance;Decreased balance;Decreased mobility;Decreased knowledge of use of DME;Decreased safety awareness;Decreased knowledge of precautions;Pain          PT Treatment Interventions DME instruction;Gait training;Stair training;Functional mobility training;Therapeutic activities;Therapeutic exercise;Neuromuscular re-education;Patient/family education    PT Goals (Current goals can be found in the Care Plan section)  Acute Rehab PT Goals Patient Stated Goal: Home tomorrow PT Goal Formulation: With patient Time For Goal Achievement: 11/25/15 Potential to Achieve Goals: Good    Frequency Min 5X/week   Barriers to discharge        Co-evaluation               End of Session Equipment Utilized During Treatment: Gait belt;Cervical collar Activity Tolerance: Patient tolerated treatment well Patient left: in chair;with call bell/phone within reach Nurse Communication: Mobility status         Time: 0812-0831 PT Time Calculation (min) (ACUTE ONLY): 19 min   Charges:   PT Evaluation $PT Eval Moderate Complexity: 1 Procedure     PT G Codes:        Rolinda Roan 12/01/15, 9:56 AM   Rolinda Roan, PT, DPT Acute Rehabilitation Services Pager: [phone]

## 2015-11-19 LAB — GLUCOSE, CAPILLARY: Glucose-Capillary: 106 mg/dL — ABNORMAL HIGH (ref 65–99)

## 2015-11-19 MED ORDER — DIAZEPAM 5 MG PO TABS: 5.0000 mg | ORAL_TABLET | Freq: Four times a day (QID) | ORAL | 0 refills | Status: AC | PRN

## 2015-11-19 MED ORDER — OXYCODONE-ACETAMINOPHEN 5-325 MG PO TABS: 1.0000 | ORAL_TABLET | ORAL | 0 refills | Status: AC | PRN

## 2015-11-19 NOTE — Discharge Summary (Signed)
Physician Discharge Summary  Patient ID: Bruce Dyer MRN: [id number] DOB/AGE: [birth year] 63 y.o.  Admit date: 11/17/2015 Discharge date: 11/19/2015  Admission Diagnoses:  Discharge Diagnoses:  Active Problems:   Cervical pseudoarthrosis [facility])   Discharged Condition: good  Hospital Course: Patient admitted to the hospital where he underwent uncomplicated 0000000 foraminotomies and posterior lateral fusion with instrumentation. Postoperatively he is doing well. Preoperative neck and upper extremity pain much improved. Mobilizing well. Ready for discharge home.  Consults:   Significant Diagnostic Studies:   Treatments:   Discharge Exam: Blood pressure (!) 142/66, pulse (!) 55, temperature 99.1 F (37.3 C), temperature source Oral, resp. rate 18, weight 103.9 kg (229 lb), SpO2 100 %. Awake and alert. Oriented and appropriate. Cranial nerve function intact. Motor and sensory function extremities normal. Wound clean and dry. Chest and abdomen benign. Disposition: 01-Home or Self Care     Medication List    TAKE these medications   allopurinol 100 MG tablet Commonly known as:  ZYLOPRIM Take 100 mg by mouth daily.   aspirin 325 MG tablet Take 325 mg by mouth daily.   atorvastatin 40 MG tablet Commonly known as:  LIPITOR Take 40 mg by mouth every evening.   chlorthalidone 25 MG tablet Commonly known as:  HYGROTON Take 25 mg by mouth daily.   clopidogrel 75 MG tablet Commonly known as:  PLAVIX Take 75 mg by mouth daily with breakfast.   diazepam 5 MG tablet Commonly known as:  VALIUM Take 1-2 tablets (5-10 mg total) by mouth every 6 (six) hours as needed for muscle spasms.   ibuprofen 800 MG tablet Commonly known as:  ADVIL,MOTRIN Take 800 mg by mouth every 6 (six) hours as needed. For pain.   KLOR-CON M20 20 MEQ tablet Generic drug:  potassium chloride SA Take 20 mEq by mouth daily.   metFORMIN 500 MG 24 hr tablet Commonly known as:  GLUCOPHAGE-XR Take  1,000 mg by mouth 2 (two) times daily.   metoprolol succinate 50 MG 24 hr tablet Commonly known as:  TOPROL-XL Take 50 mg by mouth daily. Take with or immediately following a meal.   nitroGLYCERIN 0.4 MG SL tablet Commonly known as:  NITROSTAT Place 0.4 mg under the tongue every 5 (five) minutes as needed for chest pain.   oxyCODONE-acetaminophen 5-325 MG tablet Commonly known as:  PERCOCET/ROXICET Take 1-2 tablets by mouth every 4 (four) hours as needed for moderate pain.   pantoprazole 40 MG tablet Commonly known as:  PROTONIX Take 40 mg by mouth every evening.   quinapril 40 MG tablet Commonly known as:  ACCUPRIL Take 40 mg by mouth at bedtime.   sertraline 25 MG tablet Commonly known as:  ZOLOFT Take 25 mg by mouth daily.   sitaGLIPtin 100 MG tablet Commonly known as:  JANUVIA Take 100 mg by mouth daily.   tamsulosin 0.4 MG Caps capsule Commonly known as:  FLOMAX Take 0.4 mg by mouth daily after supper.      Follow-up Information    Charlie Pitter, MD .   Specialty:  Neurosurgery Contact information: [address] [place] [address] [phone]           Signed: Charlie Pitter 11/19/2015, 9:35 AM

## 2015-11-19 NOTE — Discharge Instructions (Signed)

## 2015-11-19 NOTE — Progress Notes (Signed)
Pt and wife given D/C instructions with Rx's, verbal understanding was provided. Pt's incision is clean and dry with no sign of infection. Pt's IV was removed prior to D/C. Pt D/C'd home via wheelchair @ 1100 per MD order. Pt is stable @ D/C and has no other needs at this time. Holli Humbles, RN

## 2018-07-13 IMAGING — RF DG CERVICAL SPINE 1V
1 series · 1 of 1 positions shown · non-contrast
Comparison: 10/22/2014

CLINICAL DATA: Posterior cervical fusion

EXAM:
DG CERVICAL SPINE - 1 VIEW; DG C-ARM 61-120 MIN

[Series 1: run · 1 of 1 slices shown]
[im 1/1]
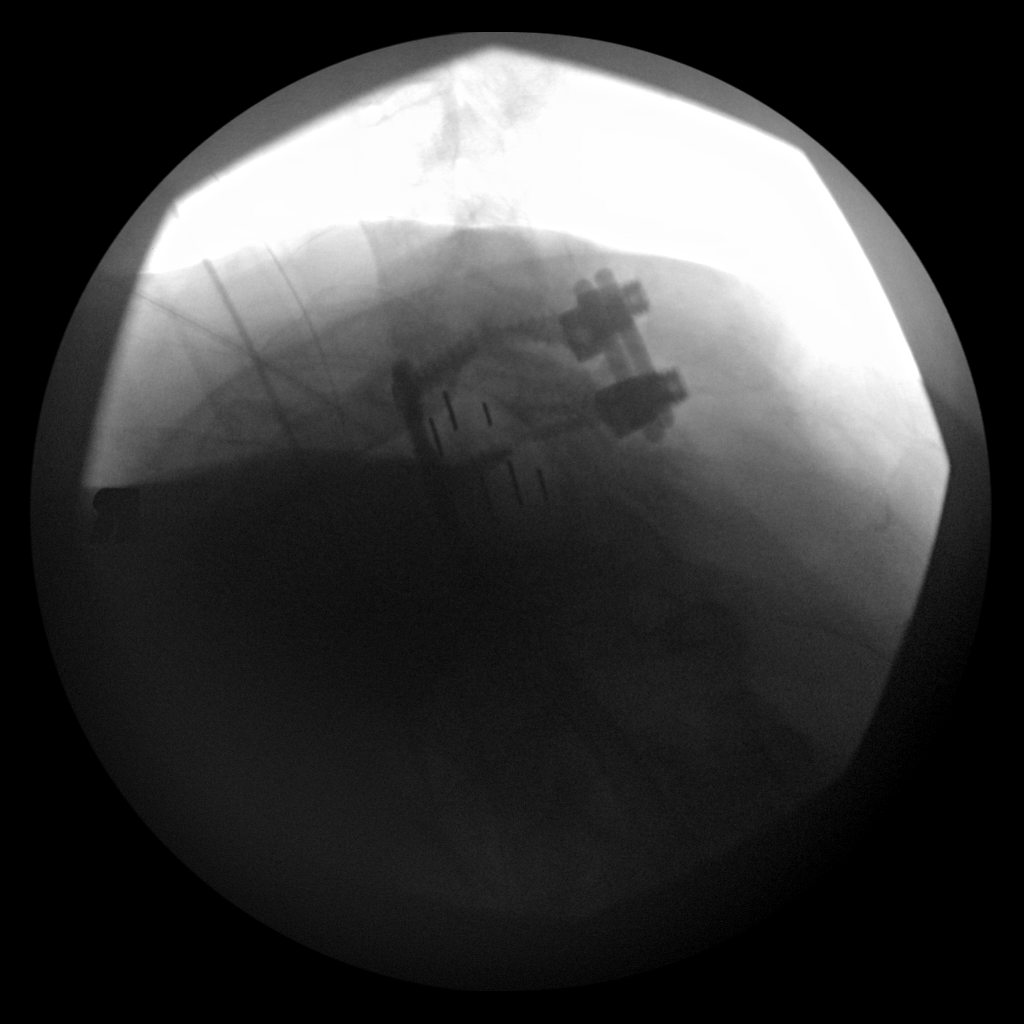

[1 of 1 positions shown; findings below may reference images not displayed]

FLUOROSCOPY TIME:  Fluoroscopy Time:  14 seconds

Radiation Exposure Index (if provided by the fluoroscopic device):
Not available

Number of Acquired Spot Images: 1
FINDINGS: Posterior fixation is noted at C5-6. The previously seen anterior
fixation at C5-6 and C6-7 is noted.
IMPRESSION: Posterior cervical fusion at C5-6.

## 2019-11-16 ENCOUNTER — Ambulatory Visit: Payer: BLUE CROSS/BLUE SHIELD
